# Patient Record
Sex: Male | Born: 1950 | Race: White | Hispanic: No | Marital: Married | Smoking: Never smoker
Health system: Southern US, Community
[De-identification: ages and names within clinical notes are randomized; demographics above are authoritative.]

## PROBLEM LIST (undated history)

## (undated) DIAGNOSIS — I10 Essential (primary) hypertension: Secondary | ICD-10-CM

## (undated) DIAGNOSIS — R972 Elevated prostate specific antigen [PSA]: Secondary | ICD-10-CM

## (undated) HISTORY — PX: TONSILLECTOMY: SUR1361

---

## 2014-11-11 ENCOUNTER — Emergency Department (HOSPITAL_COMMUNITY): Payer: Managed Care, Other (non HMO)

## 2014-11-11 ENCOUNTER — Encounter (HOSPITAL_COMMUNITY): Payer: Self-pay | Admitting: *Deleted

## 2014-11-11 ENCOUNTER — Emergency Department (HOSPITAL_COMMUNITY)
Admission: EM | Admit: 2014-11-11 | Discharge: 2014-11-12 | Disposition: A | Discharge status: Home / Self Care | Payer: Managed Care, Other (non HMO) | Attending: Emergency Medicine | Admitting: Emergency Medicine

## 2014-11-11 DIAGNOSIS — Z79899 Other long term (current) drug therapy: Secondary | ICD-10-CM | POA: Insufficient documentation

## 2014-11-11 DIAGNOSIS — R0789 Other chest pain: Secondary | ICD-10-CM | POA: Diagnosis not present

## 2014-11-11 DIAGNOSIS — R079 Chest pain, unspecified: Secondary | ICD-10-CM

## 2014-11-11 DIAGNOSIS — I10 Essential (primary) hypertension: Secondary | ICD-10-CM | POA: Diagnosis not present

## 2014-11-11 HISTORY — DX: Essential (primary) hypertension: I10

## 2014-11-11 HISTORY — DX: Elevated prostate specific antigen (PSA): R97.20

## 2014-11-11 LAB — BASIC METABOLIC PANEL
Anion gap: 9 (ref 5–15)
BUN: 16 mg/dL (ref 6–23)
CALCIUM: 9.2 mg/dL (ref 8.4–10.5)
CHLORIDE: 98 meq/L (ref 96–112)
CO2: 28 mmol/L (ref 19–32)
Creatinine, Ser: 0.91 mg/dL (ref 0.50–1.35)
GFR, EST NON AFRICAN AMERICAN: 90 mL/min — AB (ref >90)
Glucose, Bld: 101 mg/dL — ABNORMAL HIGH (ref 70–99)
Potassium: 3.5 mmol/L (ref 3.5–5.1)
SODIUM: 135 mmol/L (ref 135–145)

## 2014-11-11 LAB — CBC
HCT: 42.6 % (ref 39.0–52.0)
Hemoglobin: 14.5 g/dL (ref 13.0–17.0)
MCH: 30.3 pg (ref 26.0–34.0)
MCHC: 34 g/dL (ref 30.0–36.0)
MCV: 89.1 fL (ref 78.0–100.0)
Platelets: 271 10*3/uL (ref 150–400)
RBC: 4.78 MIL/uL (ref 4.22–5.81)
RDW: 13 % (ref 11.5–15.5)
WBC: 6.1 10*3/uL (ref 4.0–10.5)

## 2014-11-11 LAB — I-STAT TROPONIN, ED: Troponin i, poc: 0 ng/mL (ref 0.00–0.08)

## 2014-11-11 MED ORDER — GI COCKTAIL ~~LOC~~
30.0000 mL | Freq: Once | ORAL | Status: AC
  Administered 2014-11-11: 30 mL via ORAL
  Filled 2014-11-11: qty 30

## 2014-11-11 MED ORDER — IBUPROFEN 800 MG PO TABS
800.0000 mg | ORAL_TABLET | Freq: Once | ORAL | Status: DC
  Filled 2014-11-11: qty 1

## 2014-11-11 MED ORDER — HYDROCODONE-ACETAMINOPHEN 5-325 MG PO TABS
2.0000 | ORAL_TABLET | Freq: Once | ORAL | Status: AC
  Administered 2014-11-11: 2 via ORAL
  Filled 2014-11-11: qty 2

## 2014-11-11 MED ORDER — IBUPROFEN 800 MG PO TABS
800.0000 mg | ORAL_TABLET | Freq: Once | ORAL | Status: AC
  Administered 2014-11-11: 800 mg via ORAL
  Filled 2014-11-11: qty 1

## 2014-11-11 NOTE — ED Notes (Signed)
Pt states that he woke up this am with chest discomfort; pt states that he began to have chest discomfort this am; worse with palpation to the left side; pt states that he decided to take it easy this evening and eat bland and still continued to have chest discomfort; pt denies any other sx at present; pt states that he has had indigestion off and on and shortness of breath with the indigestion but none currently

## 2014-11-11 NOTE — ED Notes (Signed)
Urine collected and at bedside.

## 2014-11-11 NOTE — ED Provider Notes (Signed)
CSN: [id number]     Arrival date & time 11/11/14  2208 History   First MD Initiated Contact with Patient 11/11/14 2300     Chief Complaint  Patient presents with  . Chest Pain     (Consider location/radiation/quality/duration/timing/severity/associated sxs/prior Treatment) Patient is a 64 y.o. male presenting with chest pain.  Chest Pain Pain location:  L chest Pain quality: sharp   Pain radiates to:  Does not radiate Pain radiates to the back: no   Pain severity:  Mild Onset quality:  Gradual Duration:  3 days (worsening over past 3 days, history of similar pain for past several years) Timing:  Sporadic Progression:  Worsening Chronicity:  Chronic Context: trauma (with palpation or hitting of chest)   Relieved by:  Nothing Exacerbated by: palpation of chest. Associated symptoms: no abdominal pain, no cough, no fever, no nausea, no shortness of breath and not vomiting     Past Medical History  Diagnosis Date  . Hypertension   . Elevated PSA    Past Surgical History  Procedure Laterality Date  . Tonsillectomy     No family history on file. History  Substance Use Topics  . Smoking status: Never Smoker   . Smokeless tobacco: Not on file  . Alcohol Use: Yes     Comment: occ    Review of Systems  Constitutional: Negative for fever.  Respiratory: Negative for cough and shortness of breath.   Cardiovascular: Positive for chest pain. Negative for leg swelling.  Gastrointestinal: Negative for nausea, vomiting and abdominal pain.  All other systems reviewed and are negative.     Allergies  Review of patient's allergies indicates no known allergies.  Home Medications   Prior to Admission medications   Medication Sig Start Date End Date Taking? Authorizing Provider  Ascorbic Acid (VITAMIN C) 1000 MG tablet Take 1,000 mg by mouth 2 (two) times daily.   Yes Historical Provider, MD  diphenhydrAMINE (BENADRYL) 25 mg capsule Take 25 mg by mouth 2 (two) times daily.    Yes Historical Provider, MD  Multiple Vitamin (MULTIVITAMIN WITH MINERALS) TABS tablet Take 1 tablet by mouth 2 (two) times daily.   Yes Historical Provider, MD  Omega-3 Fatty Acids (FISH OIL PO) Take 1 tablet by mouth daily.   Yes Historical Provider, MD  valsartan (DIOVAN) 80 MG tablet Take 80 mg by mouth daily.   Yes Historical Provider, MD   BP 140/85 mmHg  Pulse 91  Temp(Src) 98 F (36.7 C) (Oral)  Resp 20  Ht 5\' 10"  (1.778 m)  Wt 165 lb (74.844 kg)  BMI 23.68 kg/m2  SpO2 100% Physical Exam  Constitutional: He is oriented to person, place, and time. He appears well-developed and well-nourished. No distress.  HENT:  Head: Normocephalic and atraumatic.  Mouth/Throat: No oropharyngeal exudate.  Eyes: EOM are normal. Pupils are equal, round, and reactive to light.  Neck: Normal range of motion. Neck supple.  Cardiovascular: Normal rate and regular rhythm.  Exam reveals no friction rub.   No murmur heard. Pulmonary/Chest: Effort normal and breath sounds normal. No respiratory distress. He has no wheezes. He has no rales. He exhibits tenderness (L sided).  Abdominal: He exhibits no distension. There is no tenderness. There is no rebound.  Musculoskeletal: Normal range of motion. He exhibits no edema.  Neurological: He is alert and oriented to person, place, and time.  Skin: He is not diaphoretic.  Nursing note and vitals reviewed.   ED Course  Procedures (including critical care time)  Labs Review Labs Reviewed  BASIC METABOLIC PANEL - Abnormal; Notable for the following:    Glucose, Bld 101 (*)    GFR calc non Af Amer 90 (*)    All other components within normal limits  CBC  I-STAT TROPOININ, ED    Imaging Review Dg Chest 2 View  11/11/2014   CLINICAL DATA:  Acute onset of left-sided chest pain and shortness of breath. Initial encounter.  EXAM: CHEST  2 VIEW  COMPARISON:  None.  FINDINGS: The lungs are well-aerated and clear. There is no evidence of focal opacification,  pleural effusion or pneumothorax.  The heart is normal in size; the mediastinal contour is within normal limits. No acute osseous abnormalities are seen.  IMPRESSION: No acute cardiopulmonary process seen.   Electronically Signed   By: Roanna Raider M.D.   On: 11/11/2014 22:59     EKG Interpretation   Date/Time:  Monday November 11 2014 22:17:05 EST Ventricular Rate:  107 PR Interval:  182 QRS Duration: 96 QT Interval:  344 QTC Calculation: 459 R Axis:   -19 Text Interpretation:  Sinus tachycardia Nonspecific ST abnormality  Nonspecific T wave abnormality No previous tracing Confirmed by Denton Lank   MD, Caryn Bee ([id number]) on 11/11/2014 10:39:31 PM      MDM   Final diagnoses:  Chest pain    64 year old male presents with chest pain. He's had multiple types of chest pain for the past 4 years. This episode has been for the past few days, he states it's worse with palpation and when his puppy and grandkids push on his chest. It's very brief, lasts only a few seconds. He thinks a slightly indigestion. No history of cardiac disease. No history of lung disease. He is hypertensive, but this is only risk factor for CAD. He reports multiple years of multiple types of chest pain, including some exertion no pain when picking things up for the past several years and this pain on palpation. It has been worse for the past few days. Here vitals stable. He has clear lungs, left-sided chest tenderness on palpation, which reproduces the pain he is here for. I suspect this is likely musculoskeletal versus GI. He is a very low heart score. EKG shows some inferior ST depression, have no prior EKG to compare this to. We'll repeat his troponin and repeat his EKG.  Repeat EKG without any inferior ST depression. Serial troponins negative. Stable for discharge, can f/u with his Cardiologist in [place].  Elwin Mocha, MD 11/12/14 [phone]

## 2014-11-12 LAB — I-STAT TROPONIN, ED: Troponin i, poc: 0 ng/mL (ref 0.00–0.08)

## 2014-11-12 MED ORDER — HYDROCODONE-ACETAMINOPHEN 5-325 MG PO TABS: 1.0000 | ORAL_TABLET | Freq: Four times a day (QID) | ORAL | Status: DC | PRN

## 2014-11-12 NOTE — Discharge Instructions (Signed)

## 2016-01-08 ENCOUNTER — Institutional Professional Consult (permissible substitution): Payer: Managed Care, Other (non HMO) | Admitting: Internal Medicine

## 2016-01-25 ENCOUNTER — Emergency Department (HOSPITAL_COMMUNITY)
Admission: EM | Admit: 2016-01-25 | Discharge: 2016-01-25 | Disposition: A | Discharge status: Home / Self Care | Payer: Managed Care, Other (non HMO) | Attending: Emergency Medicine | Admitting: Emergency Medicine

## 2016-01-25 ENCOUNTER — Emergency Department (HOSPITAL_COMMUNITY): Payer: Managed Care, Other (non HMO)

## 2016-01-25 DIAGNOSIS — I159 Secondary hypertension, unspecified: Secondary | ICD-10-CM | POA: Insufficient documentation

## 2016-01-25 DIAGNOSIS — Z79899 Other long term (current) drug therapy: Secondary | ICD-10-CM | POA: Diagnosis not present

## 2016-01-25 DIAGNOSIS — I10 Essential (primary) hypertension: Secondary | ICD-10-CM | POA: Diagnosis present

## 2016-01-25 DIAGNOSIS — R079 Chest pain, unspecified: Secondary | ICD-10-CM | POA: Insufficient documentation

## 2016-01-25 LAB — BASIC METABOLIC PANEL
Anion gap: 8 (ref 5–15)
BUN: 12 mg/dL (ref 6–20)
CO2: 30 mmol/L (ref 22–32)
CREATININE: 0.89 mg/dL (ref 0.61–1.24)
Calcium: 9.5 mg/dL (ref 8.9–10.3)
Chloride: 103 mmol/L (ref 101–111)
Glucose, Bld: 106 mg/dL — ABNORMAL HIGH (ref 65–99)
Potassium: 4.5 mmol/L (ref 3.5–5.1)
SODIUM: 141 mmol/L (ref 135–145)

## 2016-01-25 LAB — CBC
HEMATOCRIT: 42.2 % (ref 39.0–52.0)
Hemoglobin: 14.5 g/dL (ref 13.0–17.0)
MCH: 29.9 pg (ref 26.0–34.0)
MCHC: 34.4 g/dL (ref 30.0–36.0)
MCV: 87 fL (ref 78.0–100.0)
Platelets: 262 10*3/uL (ref 150–400)
RBC: 4.85 MIL/uL (ref 4.22–5.81)
RDW: 13.2 % (ref 11.5–15.5)
WBC: 5.9 10*3/uL (ref 4.0–10.5)

## 2016-01-25 LAB — I-STAT TROPONIN, ED: Troponin i, poc: 0 ng/mL (ref 0.00–0.08)

## 2016-01-25 NOTE — ED Notes (Signed)
Pt states that he was taken off his BP meds and his BP was high today so he took this morning. 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 Chest pain. Alert and oriented.

## 2016-01-25 NOTE — ED Notes (Signed)
Pt reports understanding of discharge information. No questions at time of discharge 

## 2016-01-25 NOTE — ED Provider Notes (Signed)
CSN: [id number]     Arrival date & time 01/25/16  2038 History   First MD Initiated Contact with Patient 01/25/16 2211     Chief Complaint  Patient presents with  . Hypertension  . Chest Pain     (Consider location/radiation/quality/duration/timing/severity/associated sxs/prior Treatment) HPI  Expand All Collapse All   Pt states that he was taken off his BP meds and his BP was high today so he took this morning. 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 Patient denies chest pain at the current time. Alert and oriented.        Past Medical History  Diagnosis Date  . Hypertension   . Elevated PSA    Past Surgical History  Procedure Laterality Date  . Tonsillectomy  age 65   Family History  Problem Relation Age of Onset  . Lung cancer Paternal Grandmother     smoker  . Other Father     Pulmonary Fibrosis, smoker  . Alzheimer's disease Mother    Social History  Substance Use Topics  . Smoking status: Never Smoker   . Smokeless tobacco: Not on file     Comment: socially in college  . Alcohol Use: 0.0 oz/week    0 Standard drinks or equivalent per week     Comment: 2 glasses of wine/beer w/ dinner    Review of Systems  All other systems reviewed and are negative.     Allergies  Review of patient's allergies indicates no known allergies.  Home Medications   Prior to Admission medications   Medication Sig Start Date End Date Taking? Authorizing Provider  Albuterol Sulfate (PROAIR RESPICLICK) 108 (90 Base) MCG/ACT AEPB Inhale 2 puffs into the lungs every 6 (six) hours as needed (wheezing, shortness of breath). 01/27/16   Praveen Mannam, MD  Ascorbic Acid (VITAMIN C) 1000 MG tablet Take 1,000 mg by mouth 2 (two) times daily.    Historical Provider, MD  diphenhydrAMINE (BENADRYL) 25 mg capsule Take 25 mg by mouth 2 (two) times daily.    Historical Provider, MD  Multiple Vitamin (MULTIVITAMIN WITH MINERALS) TABS tablet Take 1 tablet by mouth 2  (two) times daily.    Historical Provider, MD  Omega-3 Fatty Acids (FISH OIL PO) Take 1 tablet by mouth daily.    Historical Provider, MD  valsartan (DIOVAN) 80 MG tablet Take 80 mg by mouth daily.    Historical Provider, MD   BP 132/90 mmHg  Pulse 79  Temp(Src) 98.3 F (36.8 C) (Oral)  Resp 19  SpO2 99% Physical Exam  Constitutional: He is oriented to person, place, and time. He appears well-developed and well-nourished. No distress.  HENT:  Head: Normocephalic and atraumatic.  Eyes: Pupils are equal, round, and reactive to light.  Neck: Normal range of motion.  Cardiovascular: Normal rate and intact distal pulses.   Pulmonary/Chest: No respiratory distress.  Abdominal: Normal appearance. He exhibits no distension.  Musculoskeletal: Normal range of motion.  Neurological: He is alert and oriented to person, place, and time. No cranial nerve deficit.  Skin: Skin is warm and dry. No rash noted.  Psychiatric: He has a normal mood and affect. His behavior is normal.  Nursing note and vitals reviewed.   ED Course  Procedures (including critical care time) Medications - No data to display  BP is 130/90.  After treatment in the ED the patient feels back to baseline and wants to go home. Labs Review Labs Reviewed  BASIC METABOLIC  PANEL - Abnormal; Notable for the following:    Glucose, Bld 106 (*)    All other components within normal limits  CBC  I-STAT TROPOININ, ED    Imaging Review No results found. I have personally reviewed and evaluated these images and lab results as part of my medical decision-making.   EKG Interpretation   Date/Time:  Sunday January 25 2016 20:48:28 EDT Ventricular Rate:  81 PR Interval:  135 QRS Duration: 91 QT Interval:  369 QTC Calculation: 428 R Axis:   50 Text Interpretation:  Sinus rhythm RSR' in V1 or V2, probably normal  variant No significant change since last tracing Confirmed by Ladaija Dimino  MD,  Garret [phone]) on 01/25/2016 10:14:20 PM  Also confirmed by Radford Pax  MD,  [place] [phone]), editor [place]  CCT, [place] ([id number])  on 01/26/2016  6:53:22 AM     After treatment in the ED the patient feels back to baseline and wants to go home. MDM   Final diagnoses:  Secondary hypertension, unspecified        Nelva Nay, MD 01/28/16 [phone]

## 2016-01-25 NOTE — Discharge Instructions (Signed)

## 2016-01-27 ENCOUNTER — Encounter: Payer: Self-pay | Admitting: Pulmonary Disease

## 2016-01-27 ENCOUNTER — Ambulatory Visit (INDEPENDENT_AMBULATORY_CARE_PROVIDER_SITE_OTHER): Payer: Managed Care, Other (non HMO) | Admitting: Pulmonary Disease

## 2016-01-27 VITALS — BP 104/68 | HR 86 | Ht 70.5 in | Wt 167.2 lb

## 2016-01-27 DIAGNOSIS — R059 Cough, unspecified: Secondary | ICD-10-CM

## 2016-01-27 DIAGNOSIS — R05 Cough: Secondary | ICD-10-CM

## 2016-01-27 MED ORDER — ALBUTEROL SULFATE 108 (90 BASE) MCG/ACT IN AEPB: 2.0000 | INHALATION_SPRAY | Freq: Four times a day (QID) | RESPIRATORY_TRACT | Status: DC | PRN

## 2016-01-27 NOTE — Progress Notes (Signed)
   Subjective:    Patient ID: Jeremy Glover, male    DOB: 12/08/1950, 65 y.o.   MRN: [id number]  HPI  Evaluation for cough.  Mr. Ernestina PennaWiggins is a 65 year old with past medical history of hypertension. He has chronic cough for the past few years. This is nonproductive in nature, not associated with dyspnea, wheezing. He has daily symptoms. He wakes up in the morning coughing. He has issues of nasal discharge, postnasal drip and. He is being followed by Dr. Suszanne Connerseoh, ENT who has started him on nasal spray and pills to try his situation. He does not recall what these medications are. He had been tried on Flonase in the past but had to stop it because it caused nasal bleeding. He was also put on acid suppression medication but this did not help.  He was evaluated in the ED 2 days ago for hypertension when he stopped taking his medication and chest pain. A chest x-ray at that time showed no acute cardiopulmonary abnormality.  DATA: CXR 01/25/16 No acute cardiopulmonary disease Images reviewed  Social History: Never smoker. 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  Past Medical History  Diagnosis Date  . Hypertension   . Elevated PSA     Current outpatient prescriptions:  .  Ascorbic Acid (VITAMIN C) 1000 MG tablet, Take 1,000 mg by mouth 2 (two) times daily., Disp: , Rfl:  .  diphenhydrAMINE (BENADRYL) 25 mg capsule, Take 25 mg by mouth 2 (two) times daily., Disp: , Rfl:  .  Multiple Vitamin (MULTIVITAMIN WITH MINERALS) TABS tablet, Take 1 tablet by mouth 2 (two) times daily., Disp: , Rfl:  .  Omega-3 Fatty Acids (FISH OIL PO), Take 1 tablet by mouth daily., Disp: , Rfl:  .  valsartan (DIOVAN) 80 MG tablet, Take 80 mg by mouth daily., Disp: , Rfl:   Review of Systems Cough, nonproductive. No dyspnea,  wheezing, hemoptysis. No chest pain, palpitation. No nausea, vomiting, diarrhea, constipation. No fevers, chills. All other review of systems are negative    Objective:   Physical Exam Blood pressure 104/68, pulse 86, height 5' 10.5" (1.791 m), weight 167 lb 3.2 oz (75.841 kg), SpO2 96 %. Gen: No apparent distress Neuro: No gross focal deficits. Neck: No JVD, lymphadenopathy, thyromegaly. RS: Clear, No wheeze or crackles CVS: S1-S2 heard, no murmurs rubs gallops. Abdomen: Soft, positive bowel sounds. Extremities: No edema.    Assessment & Plan:  Cough.  Likely from upper airway syndrome, postnasal drip. He is being evaluated by ENT and is on medications. Acid suppression the past have not helped with her symptoms. I doubt whether if this is reactive airway disease, asthma. He has a family history of lung problems including cancer, pulmonary fibrosis and wants an evaluation. I reassured him that his chest x-ray looks normal. He is a never smoker and has no relevant exposures. He'll get scheduled for pulmonary function tests and I'll give him a trial of albuterol rescue inhaler.  Plan: - PFTs - Albuterol rescue inhaler.  Return on 3 months.   Chilton GreathousePraveen Martavia Tye MD [facility] Pager [phone] If no answer or after 3pm call: [phone] 01/27/2016, 9:37 AM

## 2016-01-27 NOTE — Patient Instructions (Signed)
We will schedule you for lung function tests. You will be started on an albuterol rescue inhaler.  Return to clinic in 3 months.

## 2016-04-30 ENCOUNTER — Ambulatory Visit (INDEPENDENT_AMBULATORY_CARE_PROVIDER_SITE_OTHER): Payer: Managed Care, Other (non HMO) | Admitting: Pulmonary Disease

## 2016-04-30 ENCOUNTER — Encounter (INDEPENDENT_AMBULATORY_CARE_PROVIDER_SITE_OTHER): Payer: Self-pay

## 2016-04-30 ENCOUNTER — Encounter: Payer: Self-pay | Admitting: Pulmonary Disease

## 2016-04-30 VITALS — BP 102/64 | HR 88 | Ht 70.0 in | Wt 166.0 lb

## 2016-04-30 DIAGNOSIS — R059 Cough, unspecified: Secondary | ICD-10-CM

## 2016-04-30 DIAGNOSIS — R05 Cough: Secondary | ICD-10-CM | POA: Diagnosis not present

## 2016-04-30 DIAGNOSIS — R058 Other specified cough: Secondary | ICD-10-CM | POA: Insufficient documentation

## 2016-04-30 LAB — PULMONARY FUNCTION TEST
DL/VA % PRED: 82 %
DL/VA: 3.8 ml/min/mmHg/L
DLCO COR: 22.27 ml/min/mmHg
DLCO cor % pred: 68 %
DLCO unc % pred: 69 %
DLCO unc: 22.58 ml/min/mmHg
FEF 25-75 Post: 2.32 L/sec
FEF 25-75 Pre: 1.85 L/sec
FEF2575-%CHANGE-POST: 25 %
FEF2575-%PRED-PRE: 66 %
FEF2575-%Pred-Post: 82 %
FEV1-%CHANGE-POST: 5 %
FEV1-%PRED-PRE: 80 %
FEV1-%Pred-Post: 85 %
FEV1-Post: 2.98 L
FEV1-Pre: 2.81 L
FEV1FVC-%CHANGE-POST: 4 %
FEV1FVC-%Pred-Pre: 96 %
FEV6-%Change-Post: 2 %
FEV6-%PRED-PRE: 86 %
FEV6-%Pred-Post: 88 %
FEV6-POST: 3.92 L
FEV6-PRE: 3.83 L
FEV6FVC-%Change-Post: 1 %
FEV6FVC-%PRED-POST: 104 %
FEV6FVC-%PRED-PRE: 103 %
FVC-%CHANGE-POST: 1 %
FVC-%PRED-POST: 84 %
FVC-%PRED-PRE: 83 %
FVC-POST: 3.94 L
FVC-PRE: 3.89 L
POST FEV6/FVC RATIO: 100 %
PRE FEV6/FVC RATIO: 98 %
Post FEV1/FVC ratio: 76 %
Pre FEV1/FVC ratio: 72 %
RV % PRED: 93 %
RV: 2.17 L
TLC % PRED: 85 %
TLC: 5.99 L

## 2016-04-30 MED ORDER — PANTOPRAZOLE SODIUM 40 MG PO TBEC: 40.0000 mg | DELAYED_RELEASE_TABLET | Freq: Every day | ORAL | Status: DC

## 2016-04-30 MED ORDER — BECLOMETHASONE DIPROPIONATE 40 MCG/ACT IN AERS: 1.0000 | INHALATION_SPRAY | Freq: Two times a day (BID) | RESPIRATORY_TRACT | Status: DC

## 2016-04-30 MED ORDER — AZELASTINE-FLUTICASONE 137-50 MCG/ACT NA SUSP: 1.0000 | Freq: Two times a day (BID) | NASAL | Status: DC

## 2016-04-30 NOTE — Assessment & Plan Note (Signed)
Upper Airway Cough Syndrome:Suspect GERD contributing Plan: We will add Protonix 40 mg for reflux We will start you on Qvar 40 1 puff twice daily. Start Dymista 1 spray each nare twice daily. Rinse mouth / brush teeth after use. Stop Benadryl Start chlorpheniramine 8 mg three times daily This will make you sleepy. Do not drive if sleepy. I will give you a copy of the GERD diet. Sips of water instead of throat clearing Sugar free jolly ranchers to soothe your throat. Avoid chocolate, menthol and mint. You can elevate your bed on bed blocks  Add Claritin  or any other non-sedating anti histamine once daily. You can try saline mist for nasal stuffiness. Follow up with Dr. Isaiah SergeMannam in 3 months Please contact office for sooner follow up if symptoms do not improve or worsen or seek emergency care

## 2016-04-30 NOTE — Progress Notes (Signed)
History of Present Illness Jeremy Glover is a 65 y.o. male with chronic cough for the past several years   6/30/2017Follow Up Appointment: Patient returns today for 3 month follow up. He is being seen by Dr. Isaiah SergeMannam for chronic cough. He states he is about the same. Still has the cough that is non-productive and it is not associated with dyspnea or wheezing.He has seen an EMT who has placed him on Gabapentin. He states that it made some difference initially, but the cough has recurred. He thinks his ENT is going to wean him off the gabapentin He is currently not on a PPI. He does have frequent throat clearing,and continued post nasal drip.PFT's indicate + 25% change to BD. Pt denies fever, chest pain, orthopnea, hemoptysis, leg or calf pain.   Tests  PFT's 04/30/2016 ( Personally reviewed by Dr. Isaiah SergeMannam)  FVC: 3.89 ( 83%) FEV! 2.81  ( 80%) F/F 73 TLC 5.99 (85%) DLCO 69% DB response +25%  CXR 01/25/2016:  No Acute cardiopulmonary disease  Past medical hx Past Medical History  Diagnosis Date  . Hypertension   . Elevated PSA      Past surgical hx, Family hx, Social hx all reviewed.  Current Outpatient Prescriptions on File Prior to Visit  Medication Sig  . Albuterol Sulfate (PROAIR RESPICLICK) 108 (90 Base) MCG/ACT AEPB Inhale 2 puffs into the lungs every 6 (six) hours as needed (wheezing, shortness of breath).  . Ascorbic Acid (VITAMIN C) 1000 MG tablet Take 1,000 mg by mouth 2 (two) times daily.  . diphenhydrAMINE (BENADRYL) 25 mg capsule Take 25 mg by mouth 2 (two) times daily.  . Multiple Vitamin (MULTIVITAMIN WITH MINERALS) TABS tablet Take 1 tablet by mouth 2 (two) times daily.  . Omega-3 Fatty Acids (FISH OIL PO) Take 1 tablet by mouth daily.  . valsartan (DIOVAN) 80 MG tablet Take 80 mg by mouth daily.   No current facility-administered medications on file prior to visit.     No Known Allergies  Review Of Systems:  Constitutional:   No  weight loss, night sweats,   Fevers, chills, fatigue, or  lassitude. HEENT:   No headaches,  Difficulty swallowing,  Tooth/dental problems, or  Sore throat,                No sneezing, itching, ear ache, +nasal congestion, +post nasal drip,   CV:  No chest pain,  Orthopnea, PND, swelling in lower extremities, anasarca, dizziness, palpitations, syncope.   GI  No heartburn, indigestion, abdominal pain, nausea, vomiting, diarrhea, change in bowel habits, loss of appetite, bloody stools.   Resp: No shortness of breath with exertion or at rest.  No excess mucus, no productive cough,  + non-productive cough,  No coughing up of blood.  No change in color of mucus.  No wheezing.  No chest wall deformity  Skin: no rash or lesions.  GU: no dysuria, change in color of urine, no urgency or frequency.  No flank pain, no hematuria   MS:  No joint pain or swelling.  No decreased range of motion.  No back pain.  Psych:  No change in mood or affect. No depression or anxiety.  No memory loss.   Vital Signs BP 102/64 mmHg  Pulse 88  Ht 5\' 10"  (1.778 m)  Wt 166 lb (75.297 kg)  BMI 23.82 kg/m2  SpO2 96%   Physical Exam:  General- No distress,  A&Ox3, pleasant ENT: No sinus tenderness, TM clear, pale nasal mucosa, no oral  exudate,+ post nasal drip, no LAN Cardiac: S1, S2, regular rate and rhythm, no murmur Chest: No wheeze/ rales/ dullness; no accessory muscle use, no nasal flaring, no sternal retractions Abd.: Soft Non-tender Ext: No clubbing cyanosis, edema Neuro:  normal strength Skin: No rashes, warm and dry Psych: normal mood and behavior   Assessment/Plan  Upper airway cough syndrome Upper Airway Cough Syndrome:Suspect GERD contributing Plan: We will add Protonix 40 mg for reflux We will start you on Qvar 40 1 puff twice daily. Start Dymista 1 spray each nare twice daily. Rinse mouth / brush teeth after use. Stop Benadryl Start chlorpheniramine 8 mg three times daily This will make you sleepy. Do not drive  if sleepy. I will give you a copy of the GERD diet. Sips of water instead of throat clearing Sugar free jolly ranchers to soothe your throat. Avoid chocolate, menthol and mint. You can elevate your bed on bed blocks  Add Claritin  or any other non-sedating anti histamine once daily. You can try saline mist for nasal stuffiness. Follow up with Dr. Isaiah SergeMannam in 3 months Please contact office for sooner follow up if symptoms do not improve or worsen or seek emergency care      Bevelyn NgoSarah F Kenzi Bardwell, NP 04/30/2016  2:29 PM   Attending note: I have seen and examined the patient with nurse practitioner/resident and agree with the note. History, labs and imaging reviewed.  [name] has upper airway cough syndrome Review of PFTs shows reduction in midflow rates suggestive of small airway disease  We will try to get better control of his rhinitis, post nasal drip Get started on qvar and protonix.  Chilton GreathousePraveen Mannam MD [facility] Pager [phone] If no answer or after 3pm call: [phone] 04/30/2016, 2:42 PM

## 2016-04-30 NOTE — Progress Notes (Signed)
PFT done today. 

## 2016-04-30 NOTE — Patient Instructions (Addendum)
It is nice to meet you today. We will add Protonix 40 mg for reflux We will start you on Qvar 40 1 puff twice daily. Start Dymista 1 spray each nare twice daily. Rinse mouth / brush teeth after use. Stop Benadryl Start chlorpheniramine 8 mg three times daily This will make you sleepy. Do not drive if sleepy. I will give you a copy of the GERD diet. Sips of water instead of throat clearing Sugar free jolly ranchers to soothe your throat. Avoid chocolate, menthol and mint. You can elevate your bed on bed blocks  Add Claritin  or any other non-sedating anti histamine once daily. You can try saline mist for nasal stuffiness. Follow up with Dr. Isaiah SergeMannam in 3 months Please contact office for sooner follow up if symptoms do not improve or worsen or seek emergency care

## 2016-08-02 ENCOUNTER — Ambulatory Visit (INDEPENDENT_AMBULATORY_CARE_PROVIDER_SITE_OTHER): Payer: Managed Care, Other (non HMO) | Admitting: Pulmonary Disease

## 2016-08-02 ENCOUNTER — Encounter: Payer: Self-pay | Admitting: Pulmonary Disease

## 2016-08-02 VITALS — BP 118/78 | HR 79 | Ht 70.0 in | Wt 164.2 lb

## 2016-08-02 DIAGNOSIS — R05 Cough: Secondary | ICD-10-CM

## 2016-08-02 DIAGNOSIS — R058 Other specified cough: Secondary | ICD-10-CM

## 2016-08-02 NOTE — Patient Instructions (Signed)
Instructed to wean off the Qvar. Continue the Protonix and the Zyrtec and nasal spray.  Return to clinic in 3 months.

## 2016-08-02 NOTE — Progress Notes (Signed)
Jeremy Glover    [id number]    1951-09-26  Primary Care Physician: Duane Lope, MD  Referring Physician: Gildardo Cranker, MD [address] [place], [place] [address]  Chief complaint:   Follow up for chronic cough  HPI: Jeremy Glover is a 65 year old with past medical history of hypertension. Jeremy Glover has chronic cough for the past few years. This is nonproductive in nature, not associated with dyspnea, wheezing. Jeremy Glover has daily symptoms. Jeremy Glover wakes up in the morning coughing. Jeremy Glover has issues of nasal discharge, postnasal drip and. Jeremy Glover is being followed by Dr. Suszanne Conners, ENT who has started him on nasal spray and gabapentin.  His had a recent EGD which showed a few gastric polyps, GERD without esophagitis. Is now on Protonix. Jeremy Glover was started on Qvar at the last visit. Jeremy Glover does not notice any difference in symptoms however Jeremy Glover reports intermittent atypical chest pain on using the Qvar.  Outpatient Encounter Prescriptions as of 08/02/2016  Medication Sig  . Albuterol Sulfate (PROAIR RESPICLICK) 108 (90 Base) MCG/ACT AEPB Inhale 2 puffs into the lungs every 6 (six) hours as needed (wheezing, shortness of breath).  . Ascorbic Acid (VITAMIN C) 1000 MG tablet Take 1,000 mg by mouth 2 (two) times daily.  . beclomethasone (QVAR) 40 MCG/ACT inhaler Inhale 1 puff into the lungs 2 (two) times daily.  . cetirizine (ZYRTEC) 10 MG tablet Take 10 mg by mouth 2 (two) times daily.  . Multiple Vitamin (MULTIVITAMIN WITH MINERALS) TABS tablet Take 1 tablet by mouth 2 (two) times daily.  . Omega-3 Fatty Acids (FISH OIL PO) Take 1 tablet by mouth daily.  . pantoprazole (PROTONIX) 40 MG tablet Take 1 tablet (40 mg total) by mouth daily.  . valsartan (DIOVAN) 80 MG tablet Take 80 mg by mouth daily.  . Azelastine-Fluticasone 137-50 MCG/ACT SUSP Place 1 spray into the nose 2 (two) times daily. (Patient not taking: Reported on 08/02/2016)  . diphenhydrAMINE (BENADRYL) 25 mg capsule Take 25 mg by mouth 2 (two) times daily.  Marland Kitchen  gabapentin (NEURONTIN) 300 MG capsule Take 300 mg by mouth 3 (three) times daily.   No facility-administered encounter medications on file as of 08/02/2016.     Allergies as of 08/02/2016  . (No Known Allergies)    Past Medical History:  Diagnosis Date  . Elevated PSA   . Hypertension     Past Surgical History:  Procedure Laterality Date  . TONSILLECTOMY  age 68    Family History  Problem Relation Age of Onset  . Lung cancer Paternal Grandmother     smoker  . Other Father     Pulmonary Fibrosis, smoker  . Alzheimer's disease Mother     Social History   Social History  . Marital status: Married    Spouse name: N/A  . Number of children: N/A  . Years of education: N/A   Occupational History  . Not on file.   Social History Main Topics  . Smoking status: Never Smoker  . Smokeless tobacco: Never Used     Comment: socially in college  . Alcohol use 0.0 oz/week     Comment: 2 glasses of wine/beer w/ dinner  . Drug use: No  . Sexual activity: Not on file   Other Topics Concern  . Not on file   Social History Narrative   Married, lives with spouse   2 grown Energy manager   Recent travel to [place] early March (  they own a house there)     Review of systems: Review of Systems  Constitutional: Negative for fever and chills.  HENT: Negative.   Eyes: Negative for blurred vision.  Respiratory: as per HPI  Cardiovascular: Negative for chest pain and palpitations.  Gastrointestinal: Negative for vomiting, diarrhea, blood per rectum. Genitourinary: Negative for dysuria, urgency, frequency and hematuria.  Musculoskeletal: Negative for myalgias, back pain and joint pain.  Skin: Negative for itching and rash.  Neurological: Negative for dizziness, tremors, focal weakness, seizures and loss of consciousness.  Endo/Heme/Allergies: Negative for environmental allergies.  Psychiatric/Behavioral: Negative for depression, suicidal ideas  and hallucinations.  All other systems reviewed and are negative.   Physical Exam: There were no vitals taken for this visit. Gen:      No acute distress HEENT:  EOMI, sclera anicteric Neck:     No masses; no thyromegaly Lungs:    Clear to auscultation bilaterally; normal respiratory effort CV:         Regular rate and rhythm; no murmurs Abd:      + bowel sounds; soft, non-tender; no palpable masses, no distension Ext:    No edema; adequate peripheral perfusion Skin:      Warm and dry; no rash Neuro: alert and oriented x 3 Psych: normal mood and affect  Data Reviewed: PFT's 04/30/2016 FVC: 3.89 ( 83%) FEV! 2.81  ( 80%) F/F 73 TLC 5.99 (85%) DLCO 69% DB response +25%  CXR 01/25/2016: No Acute cardiopulmonary disease. Images reviewed  EGD 07/13/16 Few gastric polyps, biopsy. Normal duodenum, GERD without esophagitis  Assessment:  Cough Likely from upper airway syndrome, postnasal drip. GERD. Jeremy Glover is currently on Protonix. Jeremy Glover also continues on Zyrtec, dymista nasal spray. Symptoms are better with behavioral changes to cough including use of throat lozenges, sips of water, suppression of urge to cough.  His PFTs shows possible small airway disease but I doubt if this is reactive airway disease, asthma. Jeremy Glover says that Qvar does not help and Jeremy Glover has some intermittent chest pain from it. I have asked him to stop the Qvar Jeremy Glover has a family history of lung problems including cancer, pulmonary fibrosis and wants an evaluation. I reassured him that his chest x-ray looks normal. Jeremy Glover is a never smoker and has no relevant exposures. If his chest pain continues after stopping the inhaler than Jeremy Glover would prefer to get a CT chest for a complete evaluation.  Plan/Recommendations: - OK to stop qvar - Continue zyrtec, dymista, protonix  Chilton GreathousePraveen Alizey Noren MD [facility] Pager [phone] 08/02/2016, 4:49 PM  CC: Gildardo Crankeross, Charles, MD

## 2016-08-03 ENCOUNTER — Encounter: Payer: Self-pay | Admitting: Pulmonary Disease

## 2016-08-24 ENCOUNTER — Encounter: Payer: Self-pay | Admitting: Pulmonary Disease

## 2016-11-17 ENCOUNTER — Ambulatory Visit: Payer: Managed Care, Other (non HMO) | Admitting: Pulmonary Disease

## 2017-01-21 ENCOUNTER — Encounter: Payer: Self-pay | Admitting: Pulmonary Disease

## 2017-01-21 ENCOUNTER — Other Ambulatory Visit (INDEPENDENT_AMBULATORY_CARE_PROVIDER_SITE_OTHER): Payer: Managed Care, Other (non HMO)

## 2017-01-21 ENCOUNTER — Ambulatory Visit (INDEPENDENT_AMBULATORY_CARE_PROVIDER_SITE_OTHER): Payer: Managed Care, Other (non HMO) | Admitting: Pulmonary Disease

## 2017-01-21 VITALS — BP 120/70 | HR 85 | Ht 70.0 in | Wt 170.0 lb

## 2017-01-21 DIAGNOSIS — R05 Cough: Secondary | ICD-10-CM | POA: Diagnosis not present

## 2017-01-21 DIAGNOSIS — R059 Cough, unspecified: Secondary | ICD-10-CM

## 2017-01-21 DIAGNOSIS — J4530 Mild persistent asthma, uncomplicated: Secondary | ICD-10-CM | POA: Insufficient documentation

## 2017-01-21 LAB — CBC WITH DIFFERENTIAL/PLATELET
BASOS PCT: 0.6 % (ref 0.0–3.0)
Basophils Absolute: 0 10*3/uL (ref 0.0–0.1)
EOS PCT: 5.5 % — AB (ref 0.0–5.0)
Eosinophils Absolute: 0.3 10*3/uL (ref 0.0–0.7)
HCT: 44.4 % (ref 39.0–52.0)
HEMOGLOBIN: 14.9 g/dL (ref 13.0–17.0)
Lymphocytes Relative: 43.8 % (ref 12.0–46.0)
Lymphs Abs: 2.6 10*3/uL (ref 0.7–4.0)
MCHC: 33.6 g/dL (ref 30.0–36.0)
MCV: 89.4 fl (ref 78.0–100.0)
MONO ABS: 0.7 10*3/uL (ref 0.1–1.0)
MONOS PCT: 11.5 % (ref 3.0–12.0)
Neutro Abs: 2.3 10*3/uL (ref 1.4–7.7)
Neutrophils Relative %: 38.6 % — ABNORMAL LOW (ref 43.0–77.0)
Platelets: 252 10*3/uL (ref 150.0–400.0)
RBC: 4.97 Mil/uL (ref 4.22–5.81)
RDW: 13.7 % (ref 11.5–15.5)
WBC: 5.9 10*3/uL (ref 4.0–10.5)

## 2017-01-21 LAB — NITRIC OXIDE: NITRIC OXIDE: 49

## 2017-01-21 MED ORDER — FLUTICASONE FUROATE-VILANTEROL 200-25 MCG/INH IN AEPB: 1.0000 | INHALATION_SPRAY | Freq: Every day | RESPIRATORY_TRACT | 5 refills | Status: DC

## 2017-01-21 MED ORDER — FLUTICASONE FUROATE-VILANTEROL 200-25 MCG/INH IN AEPB: 1.0000 | INHALATION_SPRAY | Freq: Every day | RESPIRATORY_TRACT | 0 refills | Status: AC

## 2017-01-21 NOTE — Progress Notes (Signed)
Jeremy Glover    [id number]    02/27/1951  Primary Care Physician:Alan Tenny Crawoss, MD  RefGwenlyn Saranerring Physician: Daisy Floroharles Alan Ross, MD [address] [place], [place] [id number]  Chief complaint:   Follow up for  Chronic cough Mild persistent asthma  HPI: Jeremy Glover is a 66 year old with past medical history of hypertension. He has chronic cough for the past few years. This is nonproductive in nature, not associated with dyspnea, wheezing. He has daily symptoms. He wakes up in the morning coughing. He has issues of nasal discharge, postnasal drip and. He is being followed by Dr. Suszanne Connerseoh, ENT who has started him on nasal spray and gabapentin.  His had a recent EGD which showed a few gastric polyps, GERD without esophagitis. Is now on Protonix. He was started on Qvar at the last visit. He does not notice any difference in symptoms however he reports intermittent atypical chest pain on using the Qvar.  Interim History: He has noticed slightly increasing cough, occasional dyspnea since his last visit. Is not on any inhalers and has stopped taking the qvar  Outpatient Encounter Prescriptions as of 01/21/2017  Medication Sig  . Ascorbic Acid (VITAMIN C) 1000 MG tablet Take 1,000 mg by mouth 2 (two) times daily.  . beclomethasone (QVAR) 40 MCG/ACT inhaler Inhale 1 puff into the lungs 2 (two) times daily.  . cetirizine (ZYRTEC) 10 MG tablet Take 10 mg by mouth 2 (two) times daily.  . Multiple Vitamin (MULTIVITAMIN WITH MINERALS) TABS tablet Take 1 tablet by mouth 2 (two) times daily.  . Omega-3 Fatty Acids (FISH OIL PO) Take 1 tablet by mouth daily.  . valsartan (DIOVAN) 80 MG tablet Take 80 mg by mouth daily.  . [DISCONTINUED] Azelastine-Fluticasone 137-50 MCG/ACT SUSP Place 1 spray into the nose 2 (two) times daily.  . [DISCONTINUED] diphenhydrAMINE (BENADRYL) 25 mg capsule Take 25 mg by mouth 2 (two) times daily.  . [DISCONTINUED] gabapentin (NEURONTIN) 300 MG capsule Take 300 mg by mouth 3  (three) times daily.  . [DISCONTINUED] pantoprazole (PROTONIX) 40 MG tablet Take 1 tablet (40 mg total) by mouth daily.  . Albuterol Sulfate (PROAIR RESPICLICK) 108 (90 Base) MCG/ACT AEPB Inhale 2 puffs into the lungs every 6 (six) hours as needed (wheezing, shortness of breath). (Patient not taking: Reported on 01/21/2017)   No facility-administered encounter medications on file as of 01/21/2017.     Allergies as of 01/21/2017  . (No Known Allergies)    Past Medical History:  Diagnosis Date  . Elevated PSA   . Hypertension     Past Surgical History:  Procedure Laterality Date  . TONSILLECTOMY  age 18    Family History  Problem Relation Age of Onset  . Lung cancer Paternal Grandmother     smoker  . Other Father     Pulmonary Fibrosis, smoker  . Alzheimer's disease Mother     Social History   Social History  . Marital status: Married    Spouse name: N/A  . Number of children: N/A  . Years of education: N/A   Occupational History  . Not on file.   Social History Main Topics  . Smoking status: Never Smoker  . Smokeless tobacco: Never Used     Comment: socially in college  . Alcohol use 0.0 oz/week     Comment: 2 glasses of wine/beer w/ dinner  . Drug use: No  . Sexual activity: Not on file   Other Topics Concern  .  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  HENT: Negative.   Eyes: Negative for blurred vision.  Respiratory: as per HPI  Cardiovascular: Negative for chest pain and palpitations.  Gastrointestinal: Negative for vomiting, diarrhea, blood per rectum. Genitourinary: Negative for dysuria, urgency, frequency and hematuria.  Musculoskeletal: Negative for myalgias, back pain and joint pain.  Skin: Negative for  itching and rash.  Neurological: Negative for dizziness, tremors, focal weakness, seizures and loss of consciousness.  Endo/Heme/Allergies: Negative for environmental allergies.  Psychiatric/Behavioral: Negative for depression, suicidal ideas and hallucinations.  All other systems reviewed and are negative.   Physical Exam: Blood pressure 120/70, pulse 85, height 5\' 10"  (1.778 m), weight 170 lb (77.1 kg), SpO2 96 %. Gen:      No acute distress HEENT:  EOMI, sclera anicteric Neck:     No masses; no thyromegaly Lungs:    Clear to auscultation bilaterally; normal respiratory effort CV:         Regular rate and rhythm; no murmurs Abd:      + bowel sounds; soft, non-tender; no palpable masses, no distension Ext:    No edema; adequate peripheral perfusion Skin:      Warm and dry; no rash Neuro: alert and oriented x 3 Psych: normal mood and affect  Data Reviewed: PFT's 04/30/2016 FVC: 3.89 ( 83%) FEV! 2.81  ( 80%) F/F 73 TLC 5.99 (85%) DLCO 69% DB response +25%  FENO 01/21/17- 49  CXR 01/25/2016: No Acute cardiopulmonary disease.  I have reviewed the images personally  EGD 07/13/16 Few gastric polyps, biopsy. Normal duodenum, GERD without esophagitis  Assessment:  Cough Likely from upper airway syndrome, postnasal drip. GERD. He is currently on Protonix. He also continues on Zyrtec, flonase. Symptoms are better with behavioral changes to cough including use of throat lozenges, sips of water, suppression of urge to cough.  Mild persistent asthma His PFTs shows  small airway disease with improvement post bronchodilator. He says that Qvar does not help and he has some intermittent chest pain from it.  FENO is high and he will need to be on a controller medication. Start breo. Reassess in 1-2 months. He will need CBC with diff and blood allergy profile, IgE.  Plan/Recommendations: - Start Breo - Check CBC with diff and blood allergy profile.  - Continue zyrtec, flonase,  protonix  Chilton Greathouse MD [facility] Pager [phone] 01/21/2017, 4:55 PM  CC: Daisy Floro, MD

## 2017-01-21 NOTE — Progress Notes (Signed)
Patient ID: Jeremy SaranRobert Glover, male   DOB: 07/01/1951, 66 y.o.   MRN: [id number] Patient seen in the office today and instructed on use of breo ellipta.  Patient expressed understanding and demonstrated technique.

## 2017-01-21 NOTE — Patient Instructions (Addendum)
We will start you on breo 200 Continue using your antiallergy medications Stop the qvar Check CBC with diff and blood allergy profile  Return to clinic in 1-2 months.

## 2017-01-24 LAB — RESPIRATORY ALLERGY PROFILE REGION II ~~LOC~~
ALLERGEN, CEDAR TREE, T6: 0.5 kU/L — AB
ALLERGEN, P. NOTATUM, M1: 0.1 kU/L — AB
ASPERGILLUS FUMIGATUS M3: 0.21 kU/L — AB
Allergen, A. alternata, m6: 2.94 kU/L — ABNORMAL HIGH
Allergen, Comm Silver Birch, t9: <0.1 kU/L
Allergen, Mulberry, t76: <0.1 kU/L
Allergen, Oak,t7: <0.1 kU/L
Box Elder IgE: <0.1 kU/L
COMMON RAGWEED: 0.85 kU/L — AB
Cat Dander: <0.1 kU/L
Cockroach: <0.1 kU/L
Dog Dander: <0.1 kU/L
Elm IgE: 0.21 kU/L — ABNORMAL HIGH
IgE (Immunoglobulin E), Serum: 109 kU/L (ref <115)
Johnson Grass: <0.1 kU/L
Rough Pigweed  IgE: <0.1 kU/L
Sheep Sorrel IgE: <0.1 kU/L

## 2017-03-29 ENCOUNTER — Ambulatory Visit: Payer: Managed Care, Other (non HMO) | Admitting: Pulmonary Disease

## 2017-03-31 ENCOUNTER — Encounter: Payer: Self-pay | Admitting: Pulmonary Disease

## 2017-03-31 ENCOUNTER — Ambulatory Visit (INDEPENDENT_AMBULATORY_CARE_PROVIDER_SITE_OTHER): Payer: Managed Care, Other (non HMO) | Admitting: Pulmonary Disease

## 2017-03-31 VITALS — BP 128/76 | HR 72 | Ht 70.0 in | Wt 167.0 lb

## 2017-03-31 DIAGNOSIS — R058 Other specified cough: Secondary | ICD-10-CM

## 2017-03-31 DIAGNOSIS — R05 Cough: Secondary | ICD-10-CM | POA: Diagnosis not present

## 2017-03-31 DIAGNOSIS — R059 Cough, unspecified: Secondary | ICD-10-CM

## 2017-03-31 DIAGNOSIS — J4530 Mild persistent asthma, uncomplicated: Secondary | ICD-10-CM

## 2017-03-31 NOTE — Progress Notes (Signed)
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 He has chronic cough for the past few years. This is nonproductive in nature, not associated with dyspnea, wheezing. He has daily symptoms. He wakes up in the morning coughing. He has issues of nasal discharge, postnasal drip and. He is being followed by Dr. Suszanne Conners, ENT who has started him on nasal spray and gabapentin.  His had an EGD which showed a few gastric polyps, GERD without esophagitis. Is now on Protonix. He was started on Qvar at the last visit. He does not notice any difference in symptoms however he reports intermittent atypical chest pain on using the Qvar.  Interim History: He is started on Breo at last visit and noticed slight improvement in dyspnea. He continues to exercise running up to 4 miles without any issue. He was evaluated by Dr. 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 He had a CT of the neck done with follow-up pending with ENT  Outpatient Encounter Prescriptions as of 03/31/2017  Medication Sig  . Albuterol Sulfate (PROAIR RESPICLICK) 108 (90 Base) MCG/ACT AEPB Inhale 2 puffs into the lungs every 6 (six) hours as needed (wheezing, shortness of breath).  . Ascorbic Acid (VITAMIN C) 1000 MG tablet Take 1,000 mg by mouth 2 (two) times daily.  . cetirizine (ZYRTEC) 10 MG tablet Take 10 mg by mouth 2 (two) times daily.  . fluticasone furoate-vilanterol (BREO ELLIPTA) 200-25 MCG/INH AEPB Inhale 1 puff into the lungs daily.  Marland Kitchen guaiFENesin (MUCINEX) 600 MG 12 hr tablet Take 600 mg by mouth daily.  . Multiple Vitamin (MULTIVITAMIN WITH MINERALS) TABS tablet  Take 1 tablet by mouth 2 (two) times daily.  . Omega-3 Fatty Acids (FISH OIL PO) Take 1 tablet by mouth daily.  . valsartan (DIOVAN) 80 MG tablet Take 80 mg by mouth daily.   No facility-administered encounter medications on file as of 03/31/2017.     Allergies as of 03/31/2017  . (No Known Allergies)    Past Medical History:  Diagnosis Date  . Elevated PSA   . Hypertension     Past Surgical History:  Procedure Laterality Date  . TONSILLECTOMY  age 24    Family History  Problem Relation Age of Onset  . Lung cancer Paternal Grandmother        smoker  . Other Father        Pulmonary Fibrosis, smoker  . Alzheimer's disease Mother     Social History   Social History  . Marital status: Married    Spouse name: N/A  . Number of children: N/A  . Years of education: N/A   Occupational History  . Not on file.   Social History Main Topics  . Smoking status: Never Smoker  . Smokeless tobacco: Never Used     Comment: socially in college  . Alcohol use 0.0 oz/week     Comment: 2 glasses of wine/beer w/ dinner  . Drug use: No  . Sexual activity: Not on file   Other Topics Concern  . 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  HENT: Negative.   Eyes: Negative for blurred vision.  Respiratory: as per HPI  Cardiovascular: Negative for chest pain and palpitations.  Gastrointestinal: Negative for vomiting, diarrhea, blood per rectum. Genitourinary: Negative for dysuria, urgency, frequency and hematuria.  Musculoskeletal: Negative for myalgias, back pain and joint pain.  Skin: Negative for itching and rash.  Neurological: Negative for dizziness, tremors, focal weakness, seizures and loss of consciousness.  Endo/Heme/Allergies: Negative  for environmental allergies.  Psychiatric/Behavioral: Negative for depression, suicidal ideas and hallucinations.  All other systems reviewed and are negative.   Physical Exam: Blood pressure 128/76, pulse 72, height 5\' 10"  (1.778 m), weight 167 lb (75.8 kg), SpO2 97 %. Gen:      No acute distress HEENT:  EOMI, sclera anicteric Neck:     No masses; no thyromegaly Lungs:    Clear to auscultation bilaterally; normal respiratory effort CV:         Regular rate and rhythm; no murmurs Abd:      + bowel sounds; soft, non-tender; no palpable masses, no distension Ext:    No edema; adequate peripheral perfusion Skin:      Warm and dry; no rash Neuro: alert and oriented x 3 Psych: normal mood and affect  Data Reviewed: PFT's 04/30/2016 FVC: 3.89 ( 83%) FEV1 2.81  ( 80%) FEF 25-75 percent 1.85 (66%], DB response +25% F/F 73 TLC 5.99 (85%) DLCO 69% Minimal diffusion defect, small airways disease Mild diffusion defect but corrects for alveolar volume  FENO 01/21/17- 49  CXR 01/25/2016: No Acute cardiopulmonary disease.  I have reviewed the images personally  EGD 07/13/16 Few gastric polyps, biopsy. Normal duodenum, GERD without esophagitis  CBC 01/21/17-WBC 5.9, eosinophils 5.5%, absolute eosinophil count 325 Blood allergy profile IgE 109, sensitive to ragweed, elm  Assessment:  Cough Likely from upper airway syndrome, postnasal drip. GERD, vocal cord paralysis. He is currently on Protonix. He also continues on Zyrtec, flonase. Follow up with ENT.  Mild persistent asthma His PFTs shows  small airway disease with improvement post bronchodilator. FENO is high. CBC shows elevated eosinophils  He was started on breo and is doing well on it. Continue the same  Plan/Recommendations: - Continue Breo - Follow up with ENT for vocal cord paralysis  Chilton GreathousePraveen Arianna Haydon MD [facility] Pager [phone] 03/31/2017, 4:50 PM  CC: Daisy Florooss, [name], MD

## 2017-03-31 NOTE — Patient Instructions (Signed)
Continue your breo as prescribed Follow-up with ENT  Follow-up in 6 months.

## 2017-08-07 ENCOUNTER — Other Ambulatory Visit: Payer: Self-pay | Admitting: Pulmonary Disease

## 2017-09-12 ENCOUNTER — Ambulatory Visit: Payer: Managed Care, Other (non HMO) | Admitting: Pulmonary Disease

## 2017-09-13 ENCOUNTER — Encounter: Payer: Self-pay | Admitting: Pulmonary Disease

## 2017-09-13 ENCOUNTER — Ambulatory Visit: Payer: Managed Care, Other (non HMO) | Admitting: Pulmonary Disease

## 2017-09-13 VITALS — BP 128/70 | HR 90 | Ht 70.0 in | Wt 166.2 lb

## 2017-09-13 DIAGNOSIS — J4530 Mild persistent asthma, uncomplicated: Secondary | ICD-10-CM | POA: Diagnosis not present

## 2017-09-13 DIAGNOSIS — R05 Cough: Secondary | ICD-10-CM | POA: Diagnosis not present

## 2017-09-13 DIAGNOSIS — R058 Other specified cough: Secondary | ICD-10-CM

## 2017-09-13 LAB — NITRIC OXIDE: NITRIC OXIDE: 28

## 2017-09-13 NOTE — Progress Notes (Signed)
Jeremy SaranRobert Glover    [id number]    04/09/1951  Primary Care Physician:Jeremy Glover, Jeremy Roundharles Alan, Jeremy Glover  Referring Physician: [name], Jeremy Alan, Jeremy Glover [address] [place], [place] [address]  Chief complaint:   Follow up for  Chronic cough Mild persistent asthma Right vocal cord paralysis  HPI: Jeremy Glover is a 66 year old with past medical history of hypertension. He has chronic cough for the past few years. This is nonproductive in nature, not associated with dyspnea, wheezing. He has daily symptoms. He wakes up in the morning coughing. He has issues of nasal discharge, postnasal drip and. He is being followed by Dr. Suszanne Connerseoh, ENT who has started him on nasal spray and gabapentin.  His had an EGD which showed a few gastric polyps, GERD without esophagitis. Is now on Protonix. He was started on Qvar at the last visit. He does not notice any difference in symptoms however he reports intermittent atypical chest pain on using the Qvar. He was evaluated by Dr. Delford FieldWright at ENT for hoarseness and noted to have right vocal cord paralysis of unclear etiology. He had a CT of the neck done with no abnormality  Interim History: He continues on the breo without any issue.  Reports that his wheezing is improved.  He has very occasional shortness of breath and hardly needs to use his rescue inhaler.  Outpatient Encounter Medications as of 09/13/2017  Medication Sig  . Albuterol Sulfate (PROAIR RESPICLICK) 108 (90 Base) MCG/ACT AEPB Inhale 2 puffs into the lungs every 6 (six) hours as needed (wheezing, shortness of breath).  . Ascorbic Acid (VITAMIN C) 1000 MG tablet Take 1,000 mg by mouth 2 (two) times daily.  Marland Kitchen. BREO ELLIPTA 200-25 MCG/INH AEPB INHALE 1 PUFF INTO THE LUNGS DAILY.  . cetirizine (ZYRTEC) 10 MG tablet Take 10 mg by mouth 2 (two) times daily.  Marland Kitchen. losartan (COZAAR) 50 MG tablet Take 50 mg daily by mouth.  . Multiple Vitamin (MULTIVITAMIN WITH MINERALS) TABS tablet Take 1 tablet by mouth 2 (two)  times daily.  . Omega-3 Fatty Acids (FISH OIL PO) Take 1 tablet by mouth daily.  . [DISCONTINUED] guaiFENesin (MUCINEX) 600 MG 12 hr tablet Take 600 mg by mouth daily.  . [DISCONTINUED] valsartan (DIOVAN) 80 MG tablet Take 80 mg by mouth daily.   No facility-administered encounter medications on file as of 09/13/2017.     Allergies as of 09/13/2017  . (No Known Allergies)    Past Medical History:  Diagnosis Date  . Elevated PSA   . Hypertension     Past Surgical History:  Procedure Laterality Date  . TONSILLECTOMY  age 89    Family History  Problem Relation Age of Onset  . Lung cancer Paternal Grandmother        smoker  . Other Father        Pulmonary Fibrosis, smoker  . Alzheimer's disease Mother     Social History   Socioeconomic History  . Marital status: Married    Spouse name: Not on file  . Number of children: Not on file  . Years of education: Not on file  . Highest education level: Not on file  Social Needs  . Financial resource strain: Not on file  . Food insecurity - worry: Not on file  . Food insecurity - inability: Not on file  . Transportation needs - medical: Not on file  . Transportation needs - non-medical: Not on file  Occupational History  . Not  on file  Tobacco Use  . Smoking status: Never Smoker  . Smokeless tobacco: Never Used  . Tobacco comment: socially in college  Substance and Sexual Activity  . Alcohol use: Yes    Alcohol/week: 0.0 oz    Comment: 2 glasses of wine/beer w/ dinner  . Drug use: No  . Sexual activity: Not on file  Other Topics Concern  . Not on file  Social History Narrative   Married, lives with spouse   2 grown Energy managerchildren   Director of Biomedical Engineering   Recent travel to [place] early [place]they own a house there)   Review of systems: Review of Systems  Constitutional: Negative for fever and chills.  HENT: Negative.   Eyes: Negative for blurred vision.  Respiratory: as per HPI  Cardiovascular:  Negative for chest pain and palpitations.  Gastrointestinal: Negative for vomiting, diarrhea, blood per rectum. Genitourinary: Negative for dysuria, urgency, frequency and hematuria.  Musculoskeletal: Negative for myalgias, back pain and joint pain.  Skin: Negative for itching and rash.  Neurological: Negative for dizziness, tremors, focal weakness, seizures and loss of consciousness.  Endo/Heme/Allergies: Negative for environmental allergies.  Psychiatric/Behavioral: Negative for depression, suicidal ideas and hallucinations.  All other systems reviewed and are negative.   Physical Exam: Blood pressure 128/70, pulse 90, height 5\' 10"  (1.778 m), weight 166 lb 3.2 oz (75.4 kg), SpO2 95 %. Gen:      No acute distress HEENT:  EOMI, sclera anicteric Neck:     No masses; no thyromegaly Lungs:    Clear to auscultation bilaterally; normal respiratory effort CV:         Regular rate and rhythm; no murmurs Abd:      + bowel sounds; soft, non-tender; no palpable masses, no distension Ext:    No edema; adequate peripheral perfusion Skin:      Warm and dry; no rash Neuro: alert and oriented x 3 Psych: normal mood and affect  Data Reviewed: PFT's 04/30/2016 FVC: 3.89 ( 83%) FEV1 2.81  ( 80%) FEF 25-75 percent 1.85 (66%], DB response +25% F/F 73 TLC 5.99 (85%) DLCO 69% Minimal diffusion defect, small airways disease Mild diffusion defect but corrects for alveolar volume  FENO 01/21/17- 49 FENO 09/13/17- 28  CXR 01/25/2016: No acute cardiopulmonary disease.  I have reviewed the images personally  EGD 07/13/16 Few gastric polyps, biopsy. Normal duodenum, GERD without esophagitis  CBC 01/21/17-WBC 5.9, eosinophils 5.5%, absolute eosinophil count 325 Blood allergy profile IgE 109, sensitive to ragweed, elm  Assessment:  Cough Likely from upper airway syndrome, postnasal drip. GERD, vocal cord paralysis. He is currently on Protonix. He also continues on Zyrtec, flonase. Follow up with Dr.  Delford FieldWright, ENT.  Mild persistent asthma His PFTs shows  small airway disease with improvement post bronchodilator. FENO is high. CBC shows elevated eosinophils  He was started on breo and is doing well on it.  Repeat FENO shows improvement.  Continue the same  Plan/Recommendations: - Continue Breo - Follow up with ENT for vocal cord paralysis  Chilton GreathousePraveen Janeann Paisley Jeremy Glover [facility] Pager [phone] 09/13/2017, 3:56 PM  CC: Jeremy Glover, Jeremy Alan, Jeremy Glover

## 2017-09-13 NOTE — Patient Instructions (Addendum)
Continue using your Breo as prescribed We will follow you back in clinic in 6 months

## 2017-12-18 DIAGNOSIS — J4531 Mild persistent asthma with (acute) exacerbation: Secondary | ICD-10-CM | POA: Diagnosis not present

## 2017-12-18 DIAGNOSIS — R0602 Shortness of breath: Secondary | ICD-10-CM | POA: Diagnosis present

## 2017-12-18 DIAGNOSIS — Z79899 Other long term (current) drug therapy: Secondary | ICD-10-CM | POA: Insufficient documentation

## 2017-12-18 DIAGNOSIS — I10 Essential (primary) hypertension: Secondary | ICD-10-CM | POA: Insufficient documentation

## 2017-12-18 DIAGNOSIS — R0789 Other chest pain: Secondary | ICD-10-CM | POA: Diagnosis not present

## 2017-12-18 NOTE — ED Triage Notes (Addendum)
Pt brought in by EMS from home with  c/o shortness of breath  Pt reported to them that he was going up his stairs and felt out of breath so he put his oxygen monitor on and it showed his heart beat was irregular and he had some tightness in his chest  Pt states he has had indigestion tonight so he took Tums and some liquid antiacid   EMS reports pt was anxious  Denies chest pain  EMS did a 12 lead and states it was unremarkable  Oxygen placed on pt at 2 liters/min via [place] for comfort

## 2017-12-19 ENCOUNTER — Emergency Department (HOSPITAL_COMMUNITY)
Admission: EM | Admit: 2017-12-19 | Discharge: 2017-12-19 | Disposition: A | Discharge status: Home / Self Care | Payer: Medicare Other | Attending: Emergency Medicine | Admitting: Emergency Medicine

## 2017-12-19 ENCOUNTER — Emergency Department (HOSPITAL_COMMUNITY): Payer: Medicare Other

## 2017-12-19 ENCOUNTER — Encounter (HOSPITAL_COMMUNITY): Payer: Self-pay | Admitting: Emergency Medicine

## 2017-12-19 ENCOUNTER — Other Ambulatory Visit: Payer: Self-pay

## 2017-12-19 DIAGNOSIS — R062 Wheezing: Secondary | ICD-10-CM

## 2017-12-19 DIAGNOSIS — R0602 Shortness of breath: Secondary | ICD-10-CM

## 2017-12-19 DIAGNOSIS — R0789 Other chest pain: Secondary | ICD-10-CM

## 2017-12-19 LAB — BASIC METABOLIC PANEL
Anion gap: 8 (ref 5–15)
BUN: 13 mg/dL (ref 6–20)
CO2: 30 mmol/L (ref 22–32)
Calcium: 10 mg/dL (ref 8.9–10.3)
Chloride: 103 mmol/L (ref 101–111)
Creatinine, Ser: 0.85 mg/dL (ref 0.61–1.24)
Glucose, Bld: 99 mg/dL (ref 65–99)
POTASSIUM: 4.4 mmol/L (ref 3.5–5.1)
SODIUM: 141 mmol/L (ref 135–145)

## 2017-12-19 LAB — I-STAT TROPONIN, ED
TROPONIN I, POC: 0 ng/mL (ref 0.00–0.08)
Troponin i, poc: 0 ng/mL (ref 0.00–0.08)

## 2017-12-19 LAB — CBC
HEMATOCRIT: 45.2 % (ref 39.0–52.0)
HEMOGLOBIN: 15.5 g/dL (ref 13.0–17.0)
MCH: 31.2 pg (ref 26.0–34.0)
MCHC: 34.3 g/dL (ref 30.0–36.0)
MCV: 90.9 fL (ref 78.0–100.0)
Platelets: 260 10*3/uL (ref 150–400)
RBC: 4.97 MIL/uL (ref 4.22–5.81)
RDW: 13.4 % (ref 11.5–15.5)
WBC: 5.6 10*3/uL (ref 4.0–10.5)

## 2017-12-19 MED ORDER — ASPIRIN 81 MG PO CHEW
324.0000 mg | CHEWABLE_TABLET | Freq: Once | ORAL | Status: AC
  Administered 2017-12-19: 324 mg via ORAL
  Filled 2017-12-19: qty 4

## 2017-12-19 MED ORDER — DEXAMETHASONE 4 MG PO TABS
10.0000 mg | ORAL_TABLET | Freq: Once | ORAL | Status: AC
  Administered 2017-12-19: 10 mg via ORAL
  Filled 2017-12-19: qty 2

## 2017-12-19 MED ORDER — ALBUTEROL SULFATE 108 (90 BASE) MCG/ACT IN AEPB: 2.0000 | INHALATION_SPRAY | RESPIRATORY_TRACT | 0 refills | Status: DC | PRN

## 2017-12-19 MED ORDER — IPRATROPIUM-ALBUTEROL 0.5-2.5 (3) MG/3ML IN SOLN
3.0000 mL | Freq: Once | RESPIRATORY_TRACT | Status: AC
  Administered 2017-12-19: 3 mL via RESPIRATORY_TRACT
  Filled 2017-12-19: qty 3

## 2017-12-19 NOTE — ED Notes (Addendum)
Pt reports feeling much better than on arrival. When getting pt changed into hospital gown, this writer noted redness on pt's chest/abdomen that pt stated is abnormal.

## 2017-12-19 NOTE — Discharge Instructions (Signed)
Follow up with the cardiology office to arrange a stress test. Return if you have any chest discomfort which does not respond to the inhaler.

## 2017-12-19 NOTE — ED Provider Notes (Signed)
[facility]-EMERGENCY DEPT Provider Note   CSN: [id number] Arrival date & time: 12/18/17  2344     History   Chief Complaint Chief Complaint  Patient presents with  . Shortness of Breath    HPI Jeremy Glover is a 67 y.o. male.  The history is provided by the patient.  Shortness of Breath   He had onset this evening of shortness of breath and a tight feeling in his chest after climbing a flight of stairs which included approximately 20 steps.  There is no associated nausea or diaphoresis.  He has a pulse oximeter which she checked and it stated that his heart rate was fast and it could not detect pulse oximetry.  Symptoms lasted until he got an ambulance and had oxygen applied.  He rated his tightness at 5/10.  Nothing seemed to make it better or other than oxygen, nothing seemed to make it worse.  He denies any recent fatigue or decreased exercise tolerance.  He does have cardiac risk factors of hypertension but denies tobacco use, diabetes, hyperlipidemia and denies family history of premature coronary atherosclerosis.  Past Medical History:  Diagnosis Date  . Elevated PSA   . Hypertension     Patient Active Problem List   Diagnosis Date Noted  . Mild persistent asthma 01/21/2017  . Upper airway cough syndrome 04/30/2016    Past Surgical History:  Procedure Laterality Date  . TONSILLECTOMY  age 35       Home Medications    Prior to Admission medications   Medication Sig Start Date End Date Taking? Authorizing Provider  Ascorbic Acid (VITAMIN C) 1000 MG tablet Take 1,000 mg by mouth daily.    Yes [provider]  BREO ELLIPTA 200-25 MCG/INH AEPB INHALE 1 PUFF INTO THE LUNGS DAILY. 08/08/17  Yes Mannam, Praveen, MD  cetirizine (ZYRTEC) 10 MG tablet Take 10 mg by mouth daily.    Yes [provider]  Multiple Vitamin (MULTIVITAMIN WITH MINERALS) TABS tablet Take 1 tablet by mouth daily.    Yes [provider]  Omega-3  Fatty Acids (FISH OIL PO) Take 1 tablet by mouth daily.   Yes [provider]  valsartan (DIOVAN) 80 MG tablet Take 80 mg by mouth daily.   Yes [provider]  Albuterol Sulfate (PROAIR RESPICLICK) 108 (90 Base) MCG/ACT AEPB Inhale 2 puffs into the lungs every 6 (six) hours as needed (wheezing, shortness of breath). Patient not taking: Reported on 12/19/2017 01/27/16   Chilton Greathouse, MD    Family History Family History  Problem Relation Age of Onset  . Lung cancer Paternal Grandmother        smoker  . Other Father        Pulmonary Fibrosis, smoker  . Alzheimer's disease Mother     Social History Social History   Tobacco Use  . Smoking status: Never Smoker  . Smokeless tobacco: Never Used  . Tobacco comment: socially in college  Substance Use Topics  . Alcohol use: Yes    Alcohol/week: 0.0 oz    Comment: 2 glasses of wine/beer w/ dinner  . Drug use: No     Allergies   Patient has no known allergies.   Review of Systems Review of Systems  Respiratory: Positive for shortness of breath.   All other systems reviewed and are negative.    Physical Exam Updated Vital Signs BP 109/72 (BP Location: Left Arm)   Pulse 74   Temp 98.4 F (36.9 C) (Oral)  Resp 15   SpO2 97%   Physical Exam  Nursing note and vitals reviewed.  67 year old male, resting comfortably and in no acute distress. Vital signs are normal. Oxygen saturation is 97%, which is normal. Head is normocephalic and atraumatic. PERRLA, EOMI. Oropharynx is clear. Neck is nontender and supple without adenopathy or JVD. Back is nontender and there is no CVA tenderness. Lungs have a slightly prolonged exhalation phase with no overt rales, wheezes, rhonchi.  However, with forced exhalation, mild wheezing is noted. Chest is nontender. Heart has regular rate and rhythm without murmur. Abdomen is soft, flat, nontender without masses or hepatosplenomegaly and peristalsis is  normoactive. Extremities have no cyanosis or edema, full range of motion is present. Skin is warm and dry without rash. Neurologic: Mental status is normal, cranial nerves are intact, there are no motor or sensory deficits.  ED Treatments / Results  Labs (all labs ordered are listed, but only abnormal results are displayed) Labs Reviewed  BASIC METABOLIC PANEL  CBC  I-STAT TROPONIN, ED  I-STAT TROPONIN, ED    EKG  EKG Interpretation  Date/Time:  Sunday December 18 2017 23:58:30 EST Ventricular Rate:  103 PR Interval:    QRS Duration: 92 QT Interval:  332 QTC Calculation: 435 R Axis:   -17 Text Interpretation:  Sinus tachycardia Borderline left axis deviation RSR' in V1 or V2, right VCD or RVH When compared with ECG of 01/25/2016, No significant change was found Confirmed by Dione Booze ([id number]) on 12/19/2017 12:21:12 AM       Radiology Dg Chest 2 View  Result Date: 12/19/2017 CLINICAL DATA:  Irregular heart beat, chest tightness EXAM: CHEST  2 VIEW COMPARISON:  01/25/2016 FINDINGS: The heart size and mediastinal contours are within normal limits. Both lungs are clear. The visualized skeletal structures are unremarkable. IMPRESSION: No active cardiopulmonary disease. Electronically Signed   By: Jasmine Pang M.D.   On: 12/19/2017 01:40    Procedures Procedures (including critical care time)  Medications Ordered in ED Medications  ipratropium-albuterol (DUONEB) 0.5-2.5 (3) MG/3ML nebulizer solution 3 mL (3 mLs Nebulization Given 12/19/17 0421)  aspirin chewable tablet 324 mg (324 mg Oral Given 12/19/17 0421)  dexamethasone (DECADRON) tablet 10 mg (10 mg Oral Given 12/19/17 0523)     Initial Impression / Assessment and Plan / ED Course  I have reviewed the triage vital signs and the nursing notes.  Pertinent labs & imaging results that were available during my care of the patient were reviewed by me and considered in my medical decision making (see chart for  details).  Episode of dyspnea which probably is secondary to bronchospasm.  Initial ECG, troponin, chest x-ray are all unremarkable.  He will be given a dose of aspirin and given an albuterol with ipratropium nebulizer treatment.  We will need to check delta troponin.  Old records are reviewed and he has outpatient visits for mild persistent asthma.  Repeat troponin is undetectable.  He had significant improvement with albuterol with ipratropium.  On reexam, there is still minimal wheezing noted with forced exhalation.  He now tells me he had another episode where he got short of breath and chest tightness few days ago.  That was when he was running in a parking lot returning a shopping cart.  Given 2 episodes of exertional chest discomfort and dyspnea, will refer to cardiology for consideration for outpatient stress testing.  He stiffened given a dose of dexamethasone and discharged with a prescription for albuterol inhaler.  Final Clinical Impressions(s) / ED Diagnoses   Final diagnoses:  Shortness of breath  Chest tightness  Wheezing    ED Discharge Orders        Ordered    Albuterol Sulfate (PROAIR RESPICLICK) 108 (90 Base) MCG/ACT AEPB  Every 4 hours PRN     12/19/17 0519    Ambulatory referral to Cardiology    Comments:  Evaluation for possible stress testing   12/19/17 0519       Dione BoozeGlick, Jency Schnieders, MD 12/19/17 [phone]

## 2017-12-20 ENCOUNTER — Ambulatory Visit: Payer: Medicare Other | Admitting: Cardiology

## 2017-12-20 ENCOUNTER — Encounter: Payer: Self-pay | Admitting: Cardiology

## 2017-12-20 VITALS — BP 116/68 | HR 70 | Ht 70.5 in | Wt 164.8 lb

## 2017-12-20 DIAGNOSIS — R002 Palpitations: Secondary | ICD-10-CM | POA: Diagnosis not present

## 2017-12-20 DIAGNOSIS — I10 Essential (primary) hypertension: Secondary | ICD-10-CM | POA: Diagnosis not present

## 2017-12-20 DIAGNOSIS — R079 Chest pain, unspecified: Secondary | ICD-10-CM | POA: Diagnosis not present

## 2017-12-20 NOTE — Patient Instructions (Addendum)
No medication changes    TEST SCHEDULE AT [address] Your physician has requested that you have en exercise stress myoview. For further information please visit [email or web address]. Please follow instruction sheet, as given.    RECOMMENDATION  DR Herbie BaltimoreHARDING RECOMMENDS THAT YOU  PURCHASES  " Kardia" [employer] FROM THE  [employer]  APP PLAY STORE.   THE APP IS FREE , BUT THE  EQUIPMENT HAS A COST. IT IS A WAY FOR YOU TO OBTAIN A RECORDING OF YOUR HEART RATE . IT WILL BE A SHORT RHYTHM  STRIP THAT YOU CAN SHOW TO MEDICAL STAFF.     Your physician recommends that you schedule a follow-up appointment in 1 MONTH WITH DR HARDING.   Cardiac Nuclear Scan A cardiac nuclear scan is a test that measures blood flow to the heart when a person is resting and when he or she is exercising. The test looks for problems such as:  Not enough blood reaching a portion of the heart.  The heart muscle not working normally.  You may need this test if:  You have heart disease.  You have had abnormal lab results.  You have had heart surgery or angioplasty.  You have chest pain.  You have shortness of breath.  In this test, a radioactive dye (tracer) is injected into your bloodstream. After the tracer has traveled to your heart, an imaging device is used to measure how much of the tracer is absorbed by or distributed to various areas of your heart. This procedure is usually done at a hospital and takes 2-4 hours. Tell a health care provider about:  Any allergies you have.  All medicines you are taking, including vitamins, herbs, eye drops, creams, and over-the-counter medicines.  Any problems you or family members have had with the use of anesthetic medicines.  Any blood disorders you have.  Any surgeries you have had.  Any medical conditions you have.  Whether you are pregnant or may be pregnant. What are the risks? Generally, this is a safe procedure. However,  problems may occur, including:  Serious chest pain and heart attack. This is only a risk if the stress portion of the test is done.  Rapid heartbeat.  Sensation of warmth in your chest. This usually passes quickly.  What happens before the procedure?  Ask your health care provider about changing or stopping your regular medicines. This is especially important if you are taking diabetes medicines or blood thinners.  Remove your jewelry on the day of the procedure. What happens during the procedure?  An IV tube will be inserted into one of your veins.  Your health care provider will inject a small amount of radioactive tracer through the tube.  You will wait for 20-40 minutes while the tracer travels through your bloodstream.  Your heart activity will be monitored with an electrocardiogram (ECG).  You will lie down on an exam table.  Images of your heart will be taken for about 15-20 minutes.  You may be asked to exercise on a treadmill or stationary bike. While you exercise, your heart's activity will be monitored with an ECG, and your blood pressure will be checked. If you are unable to exercise, you may be given a medicine to increase blood flow to parts of your heart.  When blood flow to your heart has peaked, a tracer will again be injected through the IV tube.  After 20-40 minutes, you will get back on the exam table  and have more images taken of your heart.  When the procedure is over, your IV tube will be removed. The procedure may vary among health care providers and hospitals. Depending on the type of tracer used, scans may need to be repeated 3-4 hours later. What happens after the procedure?  Unless your health care provider tells you otherwise, you may return to your normal schedule, including diet, activities, and medicines.  Unless your health care provider tells you otherwise, you may increase your fluid intake. This will help flush the contrast dye from your  body. Drink enough fluid to keep your urine clear or pale yellow.  It is up to you to get your test results. Ask your health care provider, or the department that is doing the test, when your results will be ready. Summary  A cardiac nuclear scan measures the blood flow to the heart when a person is resting and when he or she is exercising.  You may need this test if you are at risk for heart disease.  Tell your health care provider if you are pregnant.  Unless your health care provider tells you otherwise, increase your fluid intake. This will help flush the contrast dye from your body. Drink enough fluid to keep your urine clear or pale yellow. This information is not intended to replace advice given to you by your health care provider. Make sure you discuss any questions you have with your health care provider. Document Released: 11/12/2004 Document Revised: 10/20/2016 Document Reviewed: 09/26/2013 Elsevier Interactive Patient Education  2017 [employer].

## 2017-12-20 NOTE — Progress Notes (Signed)
PCP: Daisy Floro, MD  Clinic Note: Chief Complaint  Patient presents with  . New Patient (Initial Visit)    ER follow-up for chest pain    HPI:  Jeremy Glover is a 67 y.o. 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  He also has Well-controlled hypertension with a family history only of hypertension.  No family history of premature coronary disease.  Jeremy Glover went to [facility] ER on February 17 with complaints of chest tightness and shortness of breath.  He really had 2 episodes of symptoms (first episode day before), and it was after the second episode that he went to the emergency room.  Recent Hospitalizations: See above  Studies Personally Reviewed - (if available, images/films reviewed: From Epic Chart or Care Everywhere)  None  Interval History: Jeremy Glover presents here today S the episodes that led to his ER visit, but there is also noted persistent symptoms since then.  Basically on Saturday, February 18, he was at the grocery store with his wife shopping.  As his usual he was taken the cart back to the drop-off spot and likes to run with it to get some exercise.  Usually he is able to this up without any difficulty, but by the time he got back to his car, he felt as though his heart would never slow down he felt tightness and pressure in his chest and very difficult for him to breathe.  He just felt weak and tired.  After a few minutes symptoms settle down, so he went home and just took it easy.  The next day he went up and down the stairs in his house to get something and had to do it a second time.  Doing this he just felt a sense of tightness and heaviness in his chest with dyspnea associated with  his heart rate going up much higher than it usually would do for this type of activity.  He felt like as though he could not feel his pulse, so his wife brought their pulse oximeter and it would not pick up his pulse or pulse ox.  At that point he got concerned so he called EMS.  Upon EMS arrival his heart rate was still up, but not in any type of arrhythmia in the but the 100 110 range.  He felt better with oxygen and the discomfort in his chest and fast heart rate spell resolved relatively spontaneously.  Since that day after he got up, after going home, he is noted that he really has not done much of anything, but has noted some tightness in his chest and dyspnea with doing minor exertion that for him would never cause problems in the past.  He has not, however felt the fast heart rate spells and significant dyspnea. He has not had any resting chest discomfort or dyspnea nor has he had any PND, orthopnea or edema. Except for those 2 episodes of fast heart rate spells, no further palpitations or rapid/irregular heartbeats. While he did feel somewhat dizzy and as if he may faint during the 2 major episodes noted, he has not had any further syncope or near syncope type symptoms.  No TIA or amaurosis fugax.  No melena, hematochezia,  hematuria, or epstaxis. No claudication.  ROS: A comprehensive was performed.  Pertinent symptoms noted in HPI Review of Systems  Constitutional: Negative for malaise/fatigue.  HENT: Negative for congestion (But does have postnasal drip).   Respiratory: Positive for cough, shortness of breath (Per HPI) and wheezing (Relatively controlled). Negative for sputum production.   Gastrointestinal: Negative for blood in stool, constipation and melena.  Genitourinary: Negative for hematuria.  Musculoskeletal: Negative for falls, joint pain and myalgias.  Neurological: Positive for dizziness (HPI). Negative for weakness.  Endo/Heme/Allergies: Positive for environmental allergies.   Psychiatric/Behavioral: Negative for memory loss. The patient is not nervous/anxious and does not have insomnia.   All other systems reviewed and are negative.  I have reviewed and (if needed) personally updated the patient's problem list, medications, allergies, past medical and surgical history, social and family history.   Past Medical History:  Diagnosis Date  . Elevated PSA   . Hypertension     Past Surgical History:  Procedure Laterality Date  . TONSILLECTOMY  age 39    Current Meds  Medication Sig  . Albuterol Sulfate (PROAIR RESPICLICK) 108 (90 Base) MCG/ACT AEPB Inhale 2 puffs into the lungs every 4 (four) hours as needed (wheezing, shortness of breath).  . Ascorbic Acid (VITAMIN C) 1000 MG tablet Take 1,000 mg by mouth daily.   Marland Kitchen aspirin 81 MG tablet Take 81 mg by mouth daily.  Marland Kitchen BREO ELLIPTA 200-25 MCG/INH AEPB INHALE 1 PUFF INTO THE LUNGS DAILY.  . cetirizine (ZYRTEC) 10 MG tablet Take 10 mg by mouth daily.   . Multiple Vitamin (MULTIVITAMIN WITH MINERALS) TABS tablet Take 1 tablet by mouth daily.   . Omega-3 Fatty Acids (FISH OIL PO) Take 1 tablet by mouth daily.  . valsartan (DIOVAN) 80 MG tablet Take 80 mg by mouth daily.    No Known Allergies  Social History   Tobacco Use  . Smoking status: Never Smoker  . Smokeless tobacco: Never Used  . Tobacco comment: socially in college  Substance Use Topics  . Alcohol use: Yes    Alcohol/week: 0.0 oz    Comment: 2 glasses of wine/beer w/ dinner  . Drug use: No   Social History   Social History Narrative   Married, lives with spouse   2 grown Energy manager   Recent travel to [place] early March (they own a house there)   He is very active in the warmer weather he spends time exercising on his bicycle.  He he also walks routinely and has no problems going up and down stairs at baseline.   He also has joined the [employer].    family history includes Alzheimer's  disease in his mother; Lung cancer in his paternal grandmother; Other in his father.  Wt Readings from Last 3 Encounters:  12/20/17 164 lb 12.8 oz (74.8 kg)  09/13/17 166 lb 3.2 oz (75.4 kg)  03/31/17 167 lb (75.8 kg)    PHYSICAL EXAM BP 116/68   Pulse 70   Ht 5' 10.5" (1.791 m)   Wt 164 lb 12.8 oz (74.8 kg)   BMI 23.31 kg/m  Physical Exam  Constitutional: He is oriented to person, place, and time. He appears well-developed and well-nourished. No distress.  Healthy-appearing.  Well-groomed.  HENT:  Head: Normocephalic and atraumatic.  Mouth/Throat: No oropharyngeal exudate.  Eyes: Conjunctivae and EOM are normal. Pupils are equal, round, and reactive to light. No scleral icterus.  Neck: Normal range of motion. Neck  supple. No hepatojugular reflux and no JVD present. Carotid bruit is not present. No tracheal deviation present. No thyromegaly present.  Cardiovascular: Normal rate, regular rhythm, normal heart sounds, intact distal pulses and normal pulses.  No extrasystoles are present. PMI is not displaced. Exam reveals no gallop and no friction rub.  No murmur heard. Pulmonary/Chest: Effort normal and breath sounds normal. No respiratory distress. He has no wheezes. He has no rales. He exhibits no tenderness.  Abdominal: Soft. Bowel sounds are normal. He exhibits no distension. There is no tenderness. There is no rebound.  Musculoskeletal: Normal range of motion. He exhibits no edema.  Neurological: He is alert and oriented to person, place, and time. No cranial nerve deficit.  Skin: Skin is warm and dry. He is not diaphoretic.  Mild spider/varicose veins in his ankles  Psychiatric: He has a normal mood and affect. His behavior is normal. Judgment and thought content normal.  Nursing note and vitals reviewed.    Adult ECG Report not checked today. From February 17 -sinus tachycardia, rate 103.  RSR prime noted in precordial leads, but otherwise relatively normal EKG.  No ischemic  changes.   Other studies Reviewed: Additional studies/ records that were reviewed today include:  Recent Labs:   Lab Results  Component Value Date   WBC 5.6 12/19/2017   HGB 15.5 12/19/2017   HCT 45.2 12/19/2017   MCV 90.9 12/19/2017   PLT 260 12/19/2017   Lab Results  Component Value Date   CREATININE 0.85 12/19/2017   BUN 13 12/19/2017   NA 141 12/19/2017   K 4.4 12/19/2017   CL 103 12/19/2017   CO2 30 12/19/2017   No results found for: TSH No results found for: CHOL, HDL, LDLCALC, LDLDIRECT, TRIG, CHOLHDL  ASSESSMENT / PLAN: Problem List Items Addressed This Visit    Chest pain with moderate risk for cardiac etiology - Primary    His 2 episodes of sound quite concerning for possible exertional angina symptoms.  He is an otherwise relatively healthy gentleman with only hypertension as a risk factor.  No family history.  I am not aware of his lipid levels as I do not have any available to review.  My concerns about his symptoms are could not be an arrhythmia such as A. fib since he was not able to get a pulse ox reading, or could this simply just be angina or combination of both. Plan: Treadmill Myoview, Pending results will probably need to consider statin and beta-blocker.  We also talked about the rapid heartbeat episodes associated with the chest discomfort and I think that this is some that warrants evaluation, but we may not pick anything up on a monitor.  He will try the " Lourena [employer]ardia" [employer] smart phone application.      Relevant Orders   MYOCARDIAL PERFUSION IMAGING   Essential hypertension (Chronic)    Well-controlled on Diovan.  May be adjust regimen based on what we see on Myoview      Relevant Medications   aspirin 81 MG tablet   Rapid palpitations    He had at least one episode that sounded like it may very well of been A. fib or SVT on Saturday when he was at the grocery store as well as a second episode when they could not feel a pulse or get a pulse ox  reading.  That is somewhat times a classic finding for A. fib.  We will get assessment of his cardiac function with Myoview, but  low threshold for echocardiogram.  I did not hear any abnormal heart sounds.  Recommend " Kardia" [employer] application for portable monitor.  We are unlikely to capture anything if he wears a event monitor since he is not had any further episodes and had not had any prior to this.      Relevant Orders   MYOCARDIAL PERFUSION IMAGING      Current medicines are reviewed at length with the patient today. (+/- concerns) n/a The following changes have been made: n/a  Patient Instructions  No medication changes    TEST SCHEDULE AT [address] Your physician has requested that you have en exercise stress myoview. For further information please visit [email or web address] Please follow instruction sheet, as given.  RECOMMENDATION  DR Herbie Baltimore RECOMMENDS THAT YOU  PURCHASES  " Kardia" By [employer]. FROM THE  GOOGLE/[employer]  APP PLAY STORE.   THE APP IS FREE , BUT THE  EQUIPMENT HAS A COST. IT IS A WAY FOR YOU TO OBTAIN A RECORDING OF YOUR HEART RATE . IT WILL BE A SHORT RHYTHM  STRIP THAT YOU CAN SHOW TO MEDICAL STAFF.   Your physician recommends that you schedule a follow-up appointment in 1 MONTH WITH DR Lyndsay Talamante.   Studies Ordered:   Orders Placed This Encounter  Procedures  . MYOCARDIAL PERFUSION IMAGING      Bryan Lemma, M.D., M.S. Interventional Cardiologist   Pager # [phone] Phone # [phone] [address] [place], [place] [address]   Thank you for choosing Heart[facility] at [facility]!!

## 2017-12-21 ENCOUNTER — Encounter: Payer: Self-pay | Admitting: Cardiology

## 2017-12-21 NOTE — Assessment & Plan Note (Addendum)
His 2 episodes of sound quite concerning for possible exertional angina symptoms.  He is an otherwise relatively healthy gentleman with only hypertension as a risk factor.  No family history.  I am not aware of his lipid levels as I do not have any available to review.  My concerns about his symptoms are could not be an arrhythmia such as A. fib since he was not able to get a pulse ox reading, or could this simply just be angina or combination of both. Plan: Treadmill Myoview, Pending results will probably need to consider statin and beta-blocker.  We also talked about the rapid heartbeat episodes associated with the chest discomfort and I think that this is some that warrants evaluation, but we may not pick anything up on a monitor.  He will try the " Lourena Sim[employer]sKardia" [employer] smart phone application.

## 2017-12-21 NOTE — Assessment & Plan Note (Signed)
Well-controlled on Diovan.  May be adjust regimen based on what we see on Myoview

## 2017-12-21 NOTE — Assessment & Plan Note (Signed)
He had at least one episode that sounded like it may very well of been A. fib or SVT on Saturday when he was at the grocery store as well as a second episode when they could not feel a pulse or get a pulse ox reading.  That is somewhat times a classic finding for A. fib.  We will get assessment of his cardiac function with Myoview, but low threshold for echocardiogram.  I did not hear any abnormal heart sounds.  Recommend " Kardia[employer] smart phone application for portable monitor.  We are unlikely to capture anything if he wears a event monitor since he is not had any further episodes and had not had any prior to this.

## 2018-01-03 ENCOUNTER — Telehealth (HOSPITAL_COMMUNITY): Payer: Self-pay

## 2018-01-03 NOTE — Telephone Encounter (Signed)
Encounter complete. 

## 2018-01-05 ENCOUNTER — Ambulatory Visit (HOSPITAL_COMMUNITY)
Admission: RE | Admit: 2018-01-05 | Discharge: 2018-01-05 | Disposition: A | Discharge status: Home / Self Care | Payer: Medicare Other | Source: Ambulatory Visit | Attending: Cardiovascular Disease | Admitting: Cardiovascular Disease

## 2018-01-05 DIAGNOSIS — R079 Chest pain, unspecified: Secondary | ICD-10-CM | POA: Diagnosis not present

## 2018-01-05 DIAGNOSIS — R002 Palpitations: Secondary | ICD-10-CM | POA: Insufficient documentation

## 2018-01-05 LAB — MYOCARDIAL PERFUSION IMAGING
CHL CUP NUCLEAR SDS: 1
CHL CUP NUCLEAR SRS: 2
CHL CUP RESTING HR STRESS: 68 {beats}/min
CSEPEW: 13.4 METS
CSEPHR: 101 %
CSEPPHR: 157 {beats}/min
Exercise duration (min): 12 min
Exercise duration (sec): 0 s
LVDIAVOL: 54 mL (ref 62–150)
LVSYSVOL: 111 mL
MPHR: 155 {beats}/min
RPE: 16
SSS: 3
TID: 1.09

## 2018-01-05 MED ORDER — TECHNETIUM TC 99M TETROFOSMIN IV KIT
10.9000 | PACK | Freq: Once | INTRAVENOUS | Status: AC | PRN
  Administered 2018-01-05: 10.9 via INTRAVENOUS
  Filled 2018-01-05: qty 11

## 2018-01-05 MED ORDER — TECHNETIUM TC 99M TETROFOSMIN IV KIT
30.8000 | PACK | Freq: Once | INTRAVENOUS | Status: AC | PRN
  Administered 2018-01-05: 30.8 via INTRAVENOUS
  Filled 2018-01-05: qty 31

## 2018-01-28 ENCOUNTER — Other Ambulatory Visit: Payer: Self-pay | Admitting: Pulmonary Disease

## 2018-01-30 ENCOUNTER — Encounter: Payer: Self-pay | Admitting: Cardiology

## 2018-01-30 ENCOUNTER — Ambulatory Visit: Payer: Medicare Other | Admitting: Cardiology

## 2018-01-30 VITALS — BP 121/78 | HR 75 | Ht 70.0 in | Wt 164.2 lb

## 2018-01-30 DIAGNOSIS — R002 Palpitations: Secondary | ICD-10-CM

## 2018-01-30 DIAGNOSIS — R079 Chest pain, unspecified: Secondary | ICD-10-CM

## 2018-01-30 DIAGNOSIS — I10 Essential (primary) hypertension: Secondary | ICD-10-CM

## 2018-01-30 NOTE — Patient Instructions (Addendum)
Medication Instructions:  No medication changes    Follow-Up: Your physician wants you to follow-up in: 6 months with Dr. Herbie BaltimoreHarding. You will receive a reminder letter in the mail two months in advance. If you don't receive a letter, please call our office to schedule the follow-up appointment.   Any Other Special Instructions Will Be Listed Below (If Applicable). Recommendations for vagal maneuvers:  "Bearing down"  Coughing  Gagging  Icy, cold towel on face or drink ice cold water Resume all activities Avoid Caffeine Stay Hydrated- drink at least 8-10 glasses of water a day (urine should be clear)   DR HARDING RECOMMENDS THAT YOU  PURCHASES  " Kardia" By [employer]. FROM THE  GOOGLE/[employer]  APP PLAY STORE.   THE APP IS FREE , BUT THE  EQUIPMENT HAS A COST. IT IS A WAY FOR YOU TO OBTAIN A RECORDING OF YOUR HEART RATE . IT WILL BE A SHORT RHYTHM  STRIP THAT YOU CAN SHOW TO MEDICAL STAFF.   If you need a refill on your cardiac medications before your next appointment, please call your pharmacy.

## 2018-01-30 NOTE — Progress Notes (Signed)
PCP: Daisy Floro, MD  Clinic Note: Chief Complaint  Patient presents with  . Follow-up    After Myoview.  No recurrent symptoms  . Chest Pain    No recurrent pain  . Palpitations    Tachypalpitations    HPI:  Jeremy Glover is a 67 y.o. 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  He also has Well-controlled hypertension with a family history only of hypertension.  No family history of premature coronary disease.  Jeremy Glover went to [facility] ER on February 17 with complaints of chest tightness and shortness of breath.  He really had 2 episodes of symptoms (first episode day before), and it was after the second episode that he went to the ER  He was seen in initial consultation on December 20, 2017.  He noted he  Recent Hospitalizations: See above  Studies Personally Reviewed - (if available, images/films reviewed: From Epic Chart or Care Everywhere)  Myoview January 05, 2018: Exercise 12 minutes.  Reached 101% 13.4 METs.  LVEF 51%.  Normal blood pressure response to exercise.  No EKG changes.  No evidence of ischemia or infarction.  LOW RISK  Interval History: Jeremy Glover presents here today for f/u of Myoview.  He indicates he is not had any further episodes of chest discomfort or rapid heartbeat since I last saw him.  He has been walking around and getting up and about, but has not yet actually done anything relatively vigorous until he returned here.  He has not had any feelings of the fast heart rate spells that came on, but reiterates that he is not done activity to the extent that he had prior to his 2 episodes.  No sensation of rapid irregular heartbeats or palpitations.  No recurrent chest  tightness pressure with rest or exertion.  No PND, orthopnea or edema.  No syncope/near syncope or TIA/amaurosis fugax. No claudication.  ROS: A comprehensive was performed.  Pertinent symptoms noted in HPI Review of Systems  Constitutional: Negative for malaise/fatigue.  HENT: Negative for congestion (But does have postnasal drip).   Respiratory: Negative for cough (Resolved), sputum production, shortness of breath (Per HPI) and wheezing (Resolved).   Gastrointestinal: Negative for blood in stool, constipation and melena.  Genitourinary: Negative for hematuria.  Musculoskeletal: Negative for falls, joint pain and myalgias.  Neurological: Positive for dizziness (HPI). Negative for weakness.  Endo/Heme/Allergies: Positive for environmental allergies.  Psychiatric/Behavioral: Negative for memory loss. The patient is not nervous/anxious and does not have insomnia.   All other systems reviewed and are negative.  I have reviewed and (if needed) personally updated the patient's problem list, medications, allergies, past medical and surgical history, social and family history.   Past Medical History:  Diagnosis Date  . Elevated PSA   . Hypertension     Past Surgical History:  Procedure Laterality Date  . TONSILLECTOMY  age 27    Current Meds  Medication Sig  . Ascorbic Acid (VITAMIN C) 1000 MG tablet Take 1,000 mg by mouth daily.   Marland Kitchen aspirin 81 MG tablet Take 81 mg by mouth daily.  Marland Kitchen BREO ELLIPTA 200-25 MCG/INH AEPB INHALE 1 PUFF INTO THE LUNGS DAILY.  . cetirizine (ZYRTEC) 10 MG tablet Take  10 mg by mouth daily.   . Multiple Vitamin (MULTIVITAMIN WITH MINERALS) TABS tablet Take 1 tablet by mouth daily.   . Omega-3 Fatty Acids (FISH OIL PO) Take 1 tablet by mouth daily.  . valsartan (DIOVAN) 80 MG tablet Take 80 mg by mouth daily.    No Known Allergies  Social History   Tobacco Use  . Smoking status: Never Smoker  . Smokeless tobacco: Never Used  . Tobacco comment: socially in  college  Substance Use Topics  . Alcohol use: Yes    Alcohol/week: 0.0 oz    Comment: 2 glasses of wine/beer w/ dinner  . 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  He he also walks routinely and has no problems going up and down stairs at baseline.   He also has joined the [employer].    family history includes Alzheimer's disease in his mother; Lung cancer in his paternal grandmother; Other in his father.  Wt Readings from Last 3 Encounters:  01/30/18 164 lb 3.2 oz (74.5 kg)  01/05/18 164 lb (74.4 kg)  12/20/17 164 lb 12.8 oz (74.8 kg)    PHYSICAL EXAM BP 121/78   Pulse 75   Ht 5\' 10"  (1.778 m)   Wt 164 lb 3.2 oz (74.5 kg)   SpO2 97%   BMI 23.56 kg/m  Physical Exam  Constitutional: He is oriented to person, place, and time. He appears well-developed and well-nourished. No distress.  Healthy-appearing.  Well-groomed.  HENT:  Head: Normocephalic and atraumatic.  Neck: Normal range of motion. Neck supple. No hepatojugular reflux and no JVD present. Carotid bruit is not present.  Cardiovascular: Normal rate, regular rhythm, normal heart sounds, intact distal pulses and normal pulses.  No extrasystoles are present. PMI is not displaced. Exam reveals no gallop and no friction rub.  No murmur heard. Pulmonary/Chest: Effort normal and breath sounds normal. No respiratory distress. He has no wheezes. He has no rales. He exhibits no tenderness.  Musculoskeletal: Normal range of motion. He exhibits no edema.  Neurological: He is alert and oriented to person, place, and time.  Skin: He is not diaphoretic.  Mild spider/varicose veins in his ankles  Psychiatric: He has a normal mood and affect. His behavior is normal.  Judgment and thought content normal.  Nursing note and vitals reviewed.    Adult ECG Report not checked today.   Other studies Reviewed: Additional studies/ records that were reviewed today include:  Recent Labs:   Lab Results  Component Value Date   WBC 5.6 12/19/2017   HGB 15.5 12/19/2017   HCT 45.2 12/19/2017   MCV 90.9 12/19/2017   PLT 260 12/19/2017   Lab Results  Component Value Date   CREATININE 0.85 12/19/2017   BUN 13 12/19/2017   NA 141 12/19/2017   K 4.4 12/19/2017   CL 103 12/19/2017   CO2 30 12/19/2017   No results found for: TSH No results found for: CHOL, HDL, LDLCALC, LDLDIRECT, TRIG, CHOLHDL  ASSESSMENT / PLAN: Problem List Items Addressed This Visit    Rapid palpitations - Primary (Chronic)    He has not had any further episodes.  It does sound like what he had during  the initial episode may have rare well been an SVT episode.  Unfortunately, he is not had any further episodes to be were not able to evaluate.  We did again reiterate that symptomatic occurring very frequently a monitor will probably not capture one.  However if he has a mobile rhythm monitor such as " Kardia" By [employer]. (which is an application that allows monitoring heart rhythms after purchasing a monitor strip).  This can be used in an as-needed basis to get a short rhythm strip when symptoms are occurring.    As the symptoms are potentially consistent with SVT, I explained SVT and discussed vagal maneuvers.  Also instructed him that he needs to stay adequately hydrated and avoid triggers such as caffeine.  I want him to resume full activities.      Essential hypertension (Chronic)    Relatively well controlled on Diovan.      Chest pain with moderate risk for cardiac etiology (Chronic)    2 episodes of chest pain that were somewhat concerning for exertional angina, but also potentially for tachypalpitations. Treadmill Myoview was negative and he got his heart rate up quite high.   Good exercise tolerance.  Unlikely to be fixed CAD.         Current medicines are reviewed at length with the patient today. (+/- concerns) n/a The following changes have been made: n/a Patient Instructions  Medication Instructions:  No medication changes    Follow-Up: Your physician wants you to follow-up in: 6 months with Dr. Herbie Baltimore. You will receive a reminder letter in the mail two months in advance. If you don't receive a letter, please call our office to schedule the follow-up appointment.   Any Other Special Instructions Will Be Listed Below (If Applicable). Recommendations for vagal maneuvers:  "Bearing down"  Coughing  Gagging  Icy, cold towel on face or drink ice cold water Resume all activities Avoid Caffeine Stay Hydrated- drink at least 8-10 glasses of water a day (urine should be clear)   DR Avy Barlett RECOMMENDS THAT YOU  PURCHASES  " Kardia" By [employer]. FROM THE  [employer]  APP PLAY STORE.   THE APP IS FREE , BUT THE  EQUIPMENT HAS A COST. IT IS A WAY FOR YOU TO OBTAIN A RECORDING OF YOUR HEART RATE . IT WILL BE A SHORT RHYTHM  STRIP THAT YOU CAN SHOW TO MEDICAL STAFF.   If you need a refill on your cardiac medications before your next appointment, please call your pharmacy.    Studies Ordered:   No orders of the defined types were placed in this encounter.     Bryan Lemma, M.D., M.S. Interventional Cardiologist   Pager # [phone] Phone # [phone] [address] [place], [place] [address]   Thank you for choosing Heartcare at [facility]!!

## 2018-02-01 ENCOUNTER — Encounter: Payer: Self-pay | Admitting: Cardiology

## 2018-02-01 NOTE — Assessment & Plan Note (Signed)
2 episodes of chest pain that were somewhat concerning for exertional angina, but also potentially for tachypalpitations. Treadmill Myoview was negative and he got his heart rate up quite high.  Good exercise tolerance.  Unlikely to be fixed CAD.

## 2018-02-01 NOTE — Assessment & Plan Note (Addendum)
He has not had any further episodes.  It does sound like what he had during the initial episode may have rare well been an SVT episode.  Unfortunately, he is not had any further episodes to be were not able to evaluate.  We did again reiterate that symptomatic occurring very frequently a monitor will probably not capture one.  However if he has a mobile rhythm monitor such as " Kardia" [employer]. (which is an application that allows monitoring heart rhythms after purchasing a monitor strip).  This can be used in an as-needed basis to get a short rhythm strip when symptoms are occurring.    As the symptoms are potentially consistent with SVT, I explained SVT and discussed vagal maneuvers.  Also instructed him that he needs to stay adequately hydrated and avoid triggers such as caffeine.  I want him to resume full activities.

## 2018-02-01 NOTE — Assessment & Plan Note (Signed)
Relatively well controlled on Diovan.

## 2018-03-06 ENCOUNTER — Telehealth: Payer: Self-pay | Admitting: Pulmonary Disease

## 2018-03-06 MED ORDER — PREDNISONE 10 MG PO TABS: ORAL_TABLET | ORAL | 0 refills | Status: DC

## 2018-03-06 NOTE — Telephone Encounter (Signed)
Call in prednisone taper starting at 40 mg. Reduce dose by 10 mg every 3 days.  

## 2018-03-06 NOTE — Telephone Encounter (Signed)
Called and spoke to pt.  Pt c/o prod cough with yellow thick mucus, wheezing & chest congestion x 3 days.  Denies fever, chills, sweats or body aches.  Pt is requesting recommendations.  Pt has pending apt for 03/13/18.  Dr. Isaiah Serge please advise. Thanks

## 2018-03-06 NOTE — Telephone Encounter (Signed)
Pt is aware of below message and voiced his understanding. Rx for Prednisone has been sent to preferred pharmacy. Nothing further is needed.  

## 2018-03-13 ENCOUNTER — Encounter: Payer: Self-pay | Admitting: Pulmonary Disease

## 2018-03-13 ENCOUNTER — Ambulatory Visit: Payer: Medicare Other | Admitting: Pulmonary Disease

## 2018-03-13 VITALS — BP 128/80 | HR 73 | Ht 70.0 in | Wt 162.4 lb

## 2018-03-13 DIAGNOSIS — R058 Other specified cough: Secondary | ICD-10-CM

## 2018-03-13 DIAGNOSIS — J4530 Mild persistent asthma, uncomplicated: Secondary | ICD-10-CM

## 2018-03-13 DIAGNOSIS — R05 Cough: Secondary | ICD-10-CM

## 2018-03-13 DIAGNOSIS — R059 Cough, unspecified: Secondary | ICD-10-CM

## 2018-03-13 LAB — NITRIC OXIDE: Nitric Oxide: 24

## 2018-03-13 NOTE — Progress Notes (Signed)
Jeremy Glover    [id number]    12-May-1951  Primary Care Physician:Ross, Darlen Round, MD  Referring Physician: Daisy Floro, MD [address] 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 He has chronic cough for the past few years. This is nonproductive in nature, not associated with dyspnea, wheezing. He has daily symptoms. He wakes up in the morning coughing. He has issues of nasal discharge, postnasal drip and. He is being followed by Dr. Suszanne Conners, ENT who has started him on nasal spray and gabapentin.  His had an EGD which showed a few gastric polyps, GERD without esophagitis. Is now on Protonix. He was started on Qvar at the last visit. He does not notice any difference in symptoms however he reports intermittent atypical chest pain on using the Qvar. He was evaluated by Dr. 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 He had a CT of the neck done with no abnormality  Interim History: He called our office last week with worsening dyspnea, cough, congestion.  He was given a prednisone taper with improvement in symptoms.  He still has occasional wheeze, dyspnea, sputum production otherwise overall he is improving.  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  Outpatient Encounter Medications as of 03/13/2018  Medication Sig  . Ascorbic Acid (VITAMIN C) 1000 MG tablet Take 1,000 mg by mouth daily.   Marland Kitchen aspirin 81 MG tablet Take 81 mg by mouth daily.  Marland Kitchen BREO ELLIPTA 200-25 MCG/INH AEPB INHALE 1 PUFF INTO THE LUNGS DAILY.  . cetirizine (ZYRTEC) 10 MG tablet Take 10 mg by mouth daily.   . Multiple Vitamin (MULTIVITAMIN WITH MINERALS) TABS tablet Take 1 tablet by mouth daily.   . Omega-3 Fatty Acids (FISH OIL PO)  Take 1 tablet by mouth daily.  . predniSONE (DELTASONE) 10 MG tablet 4 tabs x 3 days, 3 tabs x 3 days, 2 tabs x 3 days, 1 tab x 3 days then stop  . valsartan (DIOVAN) 80 MG tablet Take 80 mg by mouth daily.   No facility-administered encounter medications on file as of 03/13/2018.     Allergies as of 03/13/2018  . (No Known Allergies)    Past Medical History:  Diagnosis Date  . Elevated PSA   . Hypertension     Past Surgical History:  Procedure Laterality Date  . TONSILLECTOMY  age 21    Family History  Problem Relation Age of Onset  . Lung cancer Paternal Grandmother        smoker  . Other Father        Pulmonary Fibrosis, smoker  . Alzheimer's disease Mother     Social History   Socioeconomic History  . Marital status: Married    Spouse name: Not on file  . Number of children: Not on file  . Years of education: Not on file  . Highest education level: Not on file  Occupational History  . Not on file  Social Needs  . Financial resource strain: Not on file  . Food insecurity:    Worry: Not on file    Inability: Not on file  . Transportation needs:    Medical: Not on file    Non-medical:  Not on file  Tobacco Use  . Smoking status: Never Smoker  . Smokeless tobacco: Never Used  . Tobacco comment: socially in college  Substance and Sexual Activity  . Alcohol use: Yes    Alcohol/week: 0.0 oz    Comment: 2 glasses of wine/beer w/ dinner  . Drug use: No  . Sexual activity: Not on file  Lifestyle  . Physical activity:    Days per week: Not on file    Minutes per session: Not on file  . Stress: Not on file  Relationships  . Social connections:    Talks on phone: Not on file    Gets together: Not on file    Attends religious service: Not on file    Active member of club or organization: Not on file    Attends meetings of clubs or organizations: Not on file    Relationship status: Not on file  . Intimate partner violence:    Fear of current or ex partner:  Not on file    Emotionally abused: Not on file    Physically abused: Not on file    Forced sexual activity: Not on file  Other Topics Concern  . Not on file  Social History Narrative   Married, lives with spouse   2 grown Energy manager   Recent travel to [place] early March (they own a house there)   He is very active in the warmer weather he spends time exercising on his bicycle.  He he also walks routinely and has no problems going up and down stairs at baseline.   He also has joined the [employer].   Review of systems: Review of Systems  Constitutional: Negative for fever and chills.  HENT: Negative.   Eyes: Negative for blurred vision.  Respiratory: as per HPI  Cardiovascular: Negative for chest pain and palpitations.  Gastrointestinal: Negative for vomiting, diarrhea, blood per rectum. Genitourinary: Negative for dysuria, urgency, frequency and hematuria.  Musculoskeletal: Negative for myalgias, back pain and joint pain.  Skin: Negative for itching and rash.  Neurological: Negative for dizziness, tremors, focal weakness, seizures and loss of consciousness.  Endo/Heme/Allergies: Negative for environmental allergies.  Psychiatric/Behavioral: Negative for depression, suicidal ideas and hallucinations.  All other systems reviewed and are negative.  Physical Exam: Blood pressure 128/80, pulse 73, height  (1.778 m), weight 162 lb 6.4 oz (73.7 kg), SpO2 97 %. Gen:      No acute distress HEENT:  EOMI, sclera anicteric Neck:     No masses; no thyromegaly Lungs:    rare expiratory wheeze.  No crackles CV:         Regular rate and rhythm; no murmurs Abd:      + bowel sounds; soft, non-tender; no palpable masses, no distension Ext:    No edema; adequate peripheral perfusion Skin:      Warm and dry; no rash Neuro: alert and oriented x 3 Psych: normal mood and affect  Data Reviewed: PFT's 04/30/2016 FVC: 3.89 ( 83%), FEV1 2.81  (  80%), FEF 25-75 percent 1.85 (66%], DB response +25%, F/F 73, TLC 5.99 (85%), DLCO 69% Minimal diffusion defect, small airways disease Mild diffusion defect but corrects for alveolar volume  FENO  01/21/17- 49 09/13/17- 28 03/13/2018-24  Chest x-ray 01/25/2016-  No acute cardiopulmonary abnormality.  Chest x-ray 12/19/2017-no acute cardiopulmonary abnormality. I have reviewed the images personally  EGD 07/13/16 Few gastric polyps, biopsy. Normal duodenum, GERD without esophagitis  CBC 01/21/17-WBC 5.9, eosinophils 5.5%, absolute eosinophil count 325 Blood allergy profile IgE 109, sensitive to ragweed, elm  Assessment:  Mild persistent asthma with exacerbation Currently on a prednisone taper for exacerbation.  He is making slow improvement.  FENO is low today. He will finish the prednisone taper.  Continue Breo inhaler.  Cough From upper airway cough syndrome, postnasal drip, GERD, vocal cord paralysis.  Continues on Protonix, Zyrtec, Flonase.  Follows up with ENT  Plan/Recommendations: - Continue Breo, albuterol inhaler - Finish prednisone taper for asthma exacerbation.  Chilton Greathouse MD [facility] 03/13/2018, 4:41 PM  CC: Daisy Floro, MD

## 2018-03-13 NOTE — Patient Instructions (Signed)
Your test shows improving inflammation in the lung Continue the prednisone taper and the Breo inhaler Follow-up in 6 months.

## 2018-03-17 ENCOUNTER — Encounter (HOSPITAL_COMMUNITY): Payer: Self-pay

## 2018-03-17 ENCOUNTER — Emergency Department (HOSPITAL_COMMUNITY)
Admission: EM | Admit: 2018-03-17 | Discharge: 2018-03-17 | Disposition: A | Discharge status: Home / Self Care | Payer: Medicare Other | Attending: Emergency Medicine | Admitting: Emergency Medicine

## 2018-03-17 ENCOUNTER — Emergency Department (HOSPITAL_COMMUNITY): Payer: Medicare Other

## 2018-03-17 DIAGNOSIS — R079 Chest pain, unspecified: Secondary | ICD-10-CM | POA: Insufficient documentation

## 2018-03-17 DIAGNOSIS — Z79899 Other long term (current) drug therapy: Secondary | ICD-10-CM | POA: Insufficient documentation

## 2018-03-17 DIAGNOSIS — R252 Cramp and spasm: Secondary | ICD-10-CM | POA: Diagnosis present

## 2018-03-17 DIAGNOSIS — R000 Tachycardia, unspecified: Secondary | ICD-10-CM | POA: Insufficient documentation

## 2018-03-17 LAB — I-STAT TROPONIN, ED
Troponin i, poc: 0 ng/mL (ref 0.00–0.08)
Troponin i, poc: 0 ng/mL (ref 0.00–0.08)

## 2018-03-17 LAB — BASIC METABOLIC PANEL
ANION GAP: 9 (ref 5–15)
BUN: 12 mg/dL (ref 6–20)
CALCIUM: 10.2 mg/dL (ref 8.9–10.3)
CO2: 30 mmol/L (ref 22–32)
CREATININE: 1.04 mg/dL (ref 0.61–1.24)
Chloride: 102 mmol/L (ref 101–111)
GFR calc non Af Amer: >60 mL/min (ref >60)
Glucose, Bld: 115 mg/dL — ABNORMAL HIGH (ref 65–99)
Potassium: 3.9 mmol/L (ref 3.5–5.1)
SODIUM: 141 mmol/L (ref 135–145)

## 2018-03-17 LAB — CBC
HCT: 48.2 % (ref 39.0–52.0)
HEMOGLOBIN: 15.7 g/dL (ref 13.0–17.0)
MCH: 29.4 pg (ref 26.0–34.0)
MCHC: 32.6 g/dL (ref 30.0–36.0)
MCV: 90.3 fL (ref 78.0–100.0)
Platelets: 378 10*3/uL (ref 150–400)
RBC: 5.34 MIL/uL (ref 4.22–5.81)
RDW: 13.6 % (ref 11.5–15.5)
WBC: 11.3 10*3/uL — AB (ref 4.0–10.5)

## 2018-03-17 NOTE — Discharge Instructions (Addendum)
Call Dr. Tenny Craw on Monday, 03/20/2018 to schedule follow-up appointment in the office.  Return if your condition worsens or if concern for any reason

## 2018-03-17 NOTE — ED Triage Notes (Signed)
Pt states while at work he began having cramps in hand and checked hr and it was 130 and irregular. Pt reports palpitations and SOB. Denies CP.

## 2018-03-17 NOTE — ED Provider Notes (Signed)
[facility] EMERGENCY DEPARTMENT Provider Note   CSN: [id number] Arrival date & time: 03/17/18  1539     History   Chief Complaint No chief complaint on file.   HPI Jeremy Glover is a 67 y.o. male.  Sitting at desk writing while at work 2 PM today when he developed cramping in left hand.  He took his pulse ox which was 96%.  Pulse ox showed a fast heartbeat but he does not know the result.  He denies any chest pain lightheadedness or shortness of breath.  He does state that he felt "weird" he is presently asymptomatic.  No treatment prior to coming here.  No other associated symptoms.  He does admit to emotional stress since yesterday, as he bought a new car which was stressful for him nothing makes symptoms better or worse.  Symptoms resolved spontaneously without treatment.  Patient is left-handed  HPI  Past Medical History:  Diagnosis Date  . Elevated PSA   . Hypertension     Patient Active Problem List   Diagnosis Date Noted  . Chest pain with moderate risk for cardiac etiology 12/20/2017  . Rapid palpitations 12/20/2017  . Essential hypertension 12/20/2017  . Mild persistent asthma 01/21/2017  . Upper airway cough syndrome 04/30/2016    Past Surgical History:  Procedure Laterality Date  . TONSILLECTOMY  age 71        Home Medications    Prior to Admission medications   Medication Sig Start Date End Date Taking? Authorizing Provider  Ascorbic Acid (VITAMIN C) 1000 MG tablet Take 1,000 mg by mouth daily.     [provider]  aspirin 81 MG tablet Take 81 mg by mouth daily.    [provider]  BREO ELLIPTA 200-25 MCG/INH AEPB INHALE 1 PUFF INTO THE LUNGS DAILY. 01/30/18   Mannam, Colbert Coyer, MD  cetirizine (ZYRTEC) 10 MG tablet Take 10 mg by mouth daily.     [provider]  Multiple Vitamin (MULTIVITAMIN WITH MINERALS) TABS tablet Take 1 tablet by mouth daily.     [provider]  Omega-3 Fatty Acids (FISH OIL PO)  Take 1 tablet by mouth daily.    [provider]  predniSONE (DELTASONE) 10 MG tablet 4 tabs x 3 days, 3 tabs x 3 days, 2 tabs x 3 days, 1 tab x 3 days then stop 03/06/18   Mannam, Praveen, MD  valsartan (DIOVAN) 80 MG tablet Take 80 mg by mouth daily.    [provider]    Family History Family History  Problem Relation Age of Onset  . Lung cancer Paternal Grandmother        smoker  . Other Father        Pulmonary Fibrosis, smoker  . Alzheimer's disease Mother   No family history of coronary disease Social History Social History   Tobacco Use  . Smoking status: Never Smoker  . Smokeless tobacco: Never Used  . Tobacco comment: socially in college  Substance Use Topics  . Alcohol use: Yes    Alcohol/week: 0.0 oz    Comment: 2 glasses of wine/beer w/ dinner  . Drug use: No   1-2 alcoholic beverages per night Allergies   Patient has no known allergies.   Review of Systems Review of Systems  Constitutional: Negative.   HENT: Negative.   Respiratory: Negative.   Cardiovascular: Positive for chest pain.       Syncope  Gastrointestinal: Negative.   Musculoskeletal: Positive for myalgias.  Muscle cramps left hand  Skin: Negative.   Allergic/Immunologic: Negative.   Neurological: Negative.   Psychiatric/Behavioral: Negative.   All other systems reviewed and are negative.    Physical Exam Updated Vital Signs BP 133/79   Pulse 82   Temp 98.2 F (36.8 C) (Oral)   Resp 18   Ht  (1.753 m)   Wt 73.5 kg (162 lb)   SpO2 97%   BMI 23.92 kg/m   Physical Exam  Constitutional: He appears well-developed and well-nourished.  HENT:  Head: Normocephalic and atraumatic.  Eyes: Pupils are equal, round, and reactive to light. Conjunctivae are normal.  Neck: Neck supple. No tracheal deviation present. No thyromegaly present.  Cardiovascular: Normal rate and regular rhythm.  No murmur heard. Pulmonary/Chest: Effort normal and breath sounds normal.    Abdominal: Soft. Bowel sounds are normal. He exhibits no distension. There is no tenderness.  Musculoskeletal: Normal range of motion. He exhibits no edema or tenderness.  Neurological: He is alert. Coordination normal.  Skin: Skin is warm and dry. No rash noted.  Psychiatric: He has a normal mood and affect.  Nursing note and vitals reviewed.    ED Treatments / Results  Labs (all labs ordered are listed, but only abnormal results are displayed) Labs Reviewed  BASIC METABOLIC PANEL - Abnormal; Notable for the following components:      Result Value   Glucose, Bld 115 (*)    All other components within normal limits  CBC - Abnormal; Notable for the following components:   WBC 11.3 (*)    All other components within normal limits  I-STAT TROPONIN, ED    EKG EKG Interpretation  Date/Time:  Friday Mar 17 2018 15:47:42 EDT Ventricular Rate:  123 PR Interval:  136 QRS Duration: 82 QT Interval:  308 QTC Calculation: 440 R Axis:   22 Text Interpretation:  Sinus tachycardia Nonspecific ST abnormality Abnormal ECG No significant change since last tracing Confirmed by Doug Sou [phone]) on 03/17/2018 5:24:03 PM  Results for orders placed or performed during the hospital encounter of 03/17/18  Basic metabolic panel  Result Value Ref Range   Sodium 141 135 - 145 mmol/L   Potassium 3.9 3.5 - 5.1 mmol/L   Chloride 102 101 - 111 mmol/L   CO2 30 22 - 32 mmol/L   Glucose, Bld 115 (H) 65 - 99 mg/dL   BUN 12 6 - 20 mg/dL   Creatinine, Ser 1.91 0.61 - 1.24 mg/dL   Calcium 47.8 8.9 - 29.5 mg/dL   GFR calc non Af Amer >60 >60 mL/min   GFR calc Af Amer >60 >60 mL/min   Anion gap 9 5 - 15  CBC  Result Value Ref Range   WBC 11.3 (H) 4.0 - 10.5 K/uL   RBC 5.34 4.22 - 5.81 MIL/uL   Hemoglobin 15.7 13.0 - 17.0 g/dL   HCT 62.1 30.8 - 65.7 %   MCV 90.3 78.0 - 100.0 fL   MCH 29.4 26.0 - 34.0 pg   MCHC 32.6 30.0 - 36.0 g/dL   RDW 84.6 96.2 - 95.2 %   Platelets 378 150 - 400 K/uL   I-stat troponin, ED  Result Value Ref Range   Troponin i, poc 0.00 0.00 - 0.08 ng/mL   Comment 3          I-stat troponin, ED  Result Value Ref Range   Troponin i, poc 0.00 0.00 - 0.08 ng/mL   Comment 3  Dg Chest 2 View  Result Date: 03/17/2018 CLINICAL DATA:  67 year old male with dyspnea. EXAM: CHEST - 2 VIEW COMPARISON:  12/19/2017 FINDINGS: The heart size and mediastinal contours are within normal limits. Stable pulmonary hyperinflation. The visualized skeletal structures are unremarkable. IMPRESSION: No active cardiopulmonary disease.  Pulmonary hyperinflation. Electronically Signed   By: Tollie Eth M.D.   On: 03/17/2018 17:48   Radiology Dg Chest 2 View  Result Date: 03/17/2018 CLINICAL DATA:  67 year old male with dyspnea. EXAM: CHEST - 2 VIEW COMPARISON:  12/19/2017 FINDINGS: The heart size and mediastinal contours are within normal limits. Stable pulmonary hyperinflation. The visualized skeletal structures are unremarkable. IMPRESSION: No active cardiopulmonary disease.  Pulmonary hyperinflation. Electronically Signed   By: Tollie Eth M.D.   On: 03/17/2018 17:48    Procedures Procedures (including critical care time)  Medications Ordered in ED Medications - No data to display  chest reviewed by me Initial Impression / Assessment and Plan / ED Course  I have reviewed the triage vital signs and the nursing notes.  Pertinent labs & imaging results that were available during my care of the patient were reviewed by me and considered in my medical decision making (see chart for details).     Patient had stress myocardial perfusion study January 05, 2018 showing normal myocardial perfusion.  EF 51%  8:50 PM patient remains asymptomatic.  Rapid heartbeat transient likely secondary to anxiety which patient freely admits.  Doubt acute coronary syndrome.  Plan follow-up with Dr. Tenny Craw  Final Clinical Impressions(s) / ED Diagnoses  Diagnosis #1 tachycardia #2 muscle  cramps Final diagnoses:  None    ED Discharge Orders    None       Doug Sou, MD 03/17/18 2054

## 2018-08-08 ENCOUNTER — Other Ambulatory Visit: Payer: Self-pay | Admitting: Pulmonary Disease

## 2019-01-05 ENCOUNTER — Emergency Department (HOSPITAL_COMMUNITY): Payer: Medicare Other

## 2019-01-05 ENCOUNTER — Other Ambulatory Visit: Payer: Self-pay

## 2019-01-05 ENCOUNTER — Emergency Department (HOSPITAL_COMMUNITY)
Admission: EM | Admit: 2019-01-05 | Discharge: 2019-01-05 | Disposition: A | Discharge status: Home / Self Care | Payer: Medicare Other | Attending: Emergency Medicine | Admitting: Emergency Medicine

## 2019-01-05 DIAGNOSIS — J320 Chronic maxillary sinusitis: Secondary | ICD-10-CM | POA: Insufficient documentation

## 2019-01-05 DIAGNOSIS — J4530 Mild persistent asthma, uncomplicated: Secondary | ICD-10-CM | POA: Diagnosis not present

## 2019-01-05 DIAGNOSIS — Z79899 Other long term (current) drug therapy: Secondary | ICD-10-CM | POA: Insufficient documentation

## 2019-01-05 DIAGNOSIS — R05 Cough: Secondary | ICD-10-CM | POA: Diagnosis present

## 2019-01-05 DIAGNOSIS — I10 Essential (primary) hypertension: Secondary | ICD-10-CM | POA: Insufficient documentation

## 2019-01-05 DIAGNOSIS — Z7982 Long term (current) use of aspirin: Secondary | ICD-10-CM | POA: Diagnosis not present

## 2019-01-05 LAB — INFLUENZA PANEL BY PCR (TYPE A & B)
Influenza A By PCR: NEGATIVE
Influenza B By PCR: NEGATIVE

## 2019-01-05 MED ORDER — AMOXICILLIN-POT CLAVULANATE 875-125 MG PO TABS: 1.0000 | ORAL_TABLET | Freq: Two times a day (BID) | ORAL | 0 refills | Status: DC

## 2019-01-05 NOTE — ED Provider Notes (Signed)
Medical screening examination/treatment/procedure(s) were conducted as a shared visit with non-physician practitioner(s) and myself.  I personally evaluated the patient during the encounter.  None 68 year old male presents with 1 day history of hemoptysis characterizes blood mixed with his saliva.  Does endorse sinus-like issues.  Chest x-ray is negative here.  Does not take blood thinners.  His pulse oximetry is stable.  Return precautions given   Lorre Nick, MD 01/05/19 [phone]

## 2019-01-05 NOTE — ED Provider Notes (Signed)
[facility] EMERGENCY DEPARTMENT Provider Note   CSN: [id number] Arrival date & time: 01/05/19  1191    History   Chief Complaint Chief Complaint  Patient presents with  . Cough    blood in sputum    HPI Jeremy Glover is a 68 y.o. male.     The history is provided by the patient and medical records. No language interpreter was used.  Cough     68 year old with hx of HTN, asthma here with cough.  Patient states he went to visit his children and grandchildren approximately 2 weeks ago.  3 days after visiting them he developed sinus congestion and productive cough.  Cough is productive with green sputum.  Cough has increased in frequency, he endorsed throat irritation and now for the past few days he also noticed streaks of blood in his sputum.  He did not complain of any fever, chills, headache, runny nose, ear pain, shortness of breath, abdominal pain.  He was seen by his PCP 2 days ago for his symptom and was prescribed a cough syrup and nasal spray.  He felt the symptoms not improving and now with seen blood in his sputum he presented to the ED today for further evaluation.  He denies any recent travel, denies any prior history of PE or DVT, no history of malignancy, he is not a smoker.  He mention 1 of his grandchildren was diagnosed with the flu and another one was diagnosed with strep infection recently.    Past Medical History:  Diagnosis Date  . Elevated PSA   . Hypertension     Patient Active Problem List   Diagnosis Date Noted  . Chest pain with moderate risk for cardiac etiology 12/20/2017  . Rapid palpitations 12/20/2017  . Essential hypertension 12/20/2017  . Mild persistent asthma 01/21/2017  . Upper airway cough syndrome 04/30/2016    Past Surgical History:  Procedure Laterality Date  . TONSILLECTOMY  age 73        Home Medications    Prior to Admission medications   Medication Sig Start Date End Date Taking? Authorizing Provider    Ascorbic Acid (VITAMIN C) 1000 MG tablet Take 1,000 mg by mouth daily.     [provider]  aspirin 81 MG tablet Take 81 mg by mouth daily.    [provider]  BREO ELLIPTA 200-25 MCG/INH AEPB INHALE 1 PUFF INTO THE LUNGS DAILY. 08/08/18   Mannam, Colbert Coyer, MD  cetirizine (ZYRTEC) 10 MG tablet Take 10 mg by mouth daily.     [provider]  Multiple Vitamin (MULTIVITAMIN WITH MINERALS) TABS tablet Take 1 tablet by mouth daily.     [provider]  Omega-3 Fatty Acids (FISH OIL PO) Take 1 tablet by mouth daily.    [provider]  predniSONE (DELTASONE) 10 MG tablet 4 tabs x 3 days, 3 tabs x 3 days, 2 tabs x 3 days, 1 tab x 3 days then stop 03/06/18   Mannam, Praveen, MD  valsartan (DIOVAN) 80 MG tablet Take 80 mg by mouth daily.    [provider]    Family History Family History  Problem Relation Age of Onset  . Lung cancer Paternal Grandmother        smoker  . Other Father        Pulmonary Fibrosis, smoker  . Alzheimer's disease Mother     Social History Social History   Tobacco Use  . Smoking status: Never Smoker  .  Smokeless tobacco: Never Used  . Tobacco comment: socially in college  Substance Use Topics  . Alcohol use: Yes    Alcohol/week: 0.0 standard drinks    Comment: 2 glasses of wine/beer w/ dinner  . Drug use: No     Allergies   Patient has no known allergies.   Review of Systems Review of Systems  Respiratory: Positive for cough.   All other systems reviewed and are negative.    Physical Exam Updated Vital Signs BP 135/88 (BP Location: Right Arm)   Pulse 86   Temp 98.2 F (36.8 C) (Oral)   Resp 16   Ht 5\' 10"  (1.778 m)   Wt 74.8 kg   SpO2 97%   BMI 23.68 kg/m   Physical Exam Vitals signs and nursing note reviewed.  Constitutional:      General: He is not in acute distress.    Appearance: He is well-developed.  HENT:     Head: Atraumatic.     Comments: Mild tenderness to percussion of  maxillary sinuses    Right Ear: Tympanic membrane normal.     Left Ear: Tympanic membrane normal.     Nose: Nose normal. No rhinorrhea.     Mouth/Throat:     Mouth: Mucous membranes are moist.     Comments: Uvula midline, no tonsillar enlargement or exudates Eyes:     Conjunctiva/sclera: Conjunctivae normal.  Neck:     Musculoskeletal: Normal range of motion and neck supple. No neck rigidity.  Cardiovascular:     Rate and Rhythm: Normal rate and regular rhythm.     Pulses: Normal pulses.     Heart sounds: Normal heart sounds.  Pulmonary:     Effort: Pulmonary effort is normal.     Breath sounds: Normal breath sounds. No wheezing, rhonchi or rales.  Abdominal:     Palpations: Abdomen is soft.     Tenderness: There is no abdominal tenderness.  Musculoskeletal:        General: No swelling.  Lymphadenopathy:     Cervical: No cervical adenopathy.  Skin:    Findings: No rash.  Neurological:     Mental Status: He is alert and oriented to person, place, and time.      ED Treatments / Results  Labs (all labs ordered are listed, but only abnormal results are displayed) Labs Reviewed  INFLUENZA PANEL BY PCR (TYPE A & B)    EKG None  Radiology Dg Chest 2 View  Result Date: 01/05/2019 CLINICAL DATA:  Cough.  Hemoptysis. EXAM: CHEST - 2 VIEW COMPARISON:  03/17/2018 FINDINGS: The heart size and mediastinal contours are within normal limits. Both lungs are clear. The visualized skeletal structures are unremarkable. IMPRESSION: Normal exam. Electronically Signed   By: Francene Boyers M.D.   On: 01/05/2019 07:42    Procedures Procedures (including critical care time)  Medications Ordered in ED Medications - No data to display   Initial Impression / Assessment and Plan / ED Course  I have reviewed the triage vital signs and the nursing notes.  Pertinent labs & imaging results that were available during my care of the patient were reviewed by me and considered in my medical  decision making (see chart for details).        BP 132/80   Pulse 72   Temp 98.2 F (36.8 C) (Oral)   Resp 20   Ht 5\' 10"  (1.778 m)   Wt 74.8 kg   SpO2 99%   BMI 23.68 kg/m  Final Clinical Impressions(s) / ED Diagnoses   Final diagnoses:  Maxillary sinusitis, unspecified chronicity    ED Discharge Orders         Ordered    amoxicillin-clavulanate (AUGMENTIN) 875-125 MG tablet  2 times daily     01/05/19 0852         7:41 AM Patient here with productive cough with trace of blood in his sputum.  Blood is likely from tracheal irritation from persistent cough.  He has some mild hoarseness as well.  He also has nasal congestion.  I suspect this is likely to be in bronchitis.  Low suspicion for PE, or malignancy.  Due to recent flu contact, will obtain flu swab and chest x-ray.  Vital signs stable.  No hypoxia.  8:45 AM Chest x-ray unremarkable, flu test negative.  Patient is well-appearing, ambulate without any hypoxia.  At this time, reassurance given.  Since patient does have postnasal drainage with greenish sputum and his symptom has been ongoing for more than 10 days, there is concern for potential sinusitis causing his complaint.  After discussion, I agreed to prescribe patient Augmentin but made him aware of the potential antibiotic associated diarrhea and encourage patient to take probiotic.  He will follow-up with PCP for further care.   Fayrene Helper, PA-C 01/05/19 [id number]    Lorre Nick, MD 01/08/19 1310

## 2019-01-05 NOTE — ED Triage Notes (Signed)
Per pt he has been sick with a cough and a cold for about 1 week and went to see his primary and was given cough meds. Since then pt stated he has not gotten any better and feels like it has gotten worse. Pt saying he is coughing up blood. Sputum is green. No chest pain or SOB. No fevers.

## 2019-01-05 NOTE — Discharge Instructions (Signed)
Given the worsening of your symptoms including postnasal drips and trace of blood in your sputum, please take Augmentin as treatment for sinusitis.  Take probiotic to decrease risk of antibiotic associated diarrhea.

## 2019-01-05 NOTE — ED Notes (Signed)
Ambulated pt while checking his SpO2. Pt maintained at 100% while walking with no SOB or dizziness. Pt stated that he had some discomfort in his throat. RN notified.

## 2019-02-08 ENCOUNTER — Other Ambulatory Visit: Payer: Self-pay | Admitting: Pulmonary Disease

## 2019-03-12 ENCOUNTER — Other Ambulatory Visit: Payer: Self-pay | Admitting: Pulmonary Disease

## 2019-03-19 ENCOUNTER — Other Ambulatory Visit: Payer: Self-pay | Admitting: Pulmonary Disease

## 2019-03-21 ENCOUNTER — Encounter: Payer: Self-pay | Admitting: Adult Health

## 2019-03-21 ENCOUNTER — Other Ambulatory Visit: Payer: Self-pay

## 2019-03-21 ENCOUNTER — Ambulatory Visit: Payer: Medicare Other | Admitting: Adult Health

## 2019-03-21 DIAGNOSIS — J4530 Mild persistent asthma, uncomplicated: Secondary | ICD-10-CM

## 2019-03-21 MED ORDER — FLUTICASONE FUROATE-VILANTEROL 200-25 MCG/INH IN AEPB: 1.0000 | INHALATION_SPRAY | Freq: Every day | RESPIRATORY_TRACT | 11 refills | Status: AC

## 2019-03-21 MED ORDER — ALBUTEROL SULFATE HFA 108 (90 BASE) MCG/ACT IN AERS: 2.0000 | INHALATION_SPRAY | RESPIRATORY_TRACT | 3 refills | Status: AC | PRN

## 2019-03-21 NOTE — Patient Instructions (Addendum)
May try off BREO.  If breathing worsens off with wheezing, increased shortness of breath , will need to restart  Albuterol inhaler 2 puffs every 4hrs as needed.  Follow up with Pulmonary As needed   Good luck with your move.

## 2019-03-21 NOTE — Assessment & Plan Note (Signed)
Currently well controlled on Breo.  Patient is requesting a trial off of Breo which should be acceptable.  Have advised that if while off of Breo asthma flares with increased wheezing shortness of breath or increased albuterol use he will need to restart Breo.  He can call our office for further instructions.  He may use albuterol as needed for increased wheezing or shortness of breath.  Asthma action plan was discussed in detail.  Patient is moving back to [place].  Have advised him to establish with primary care provider and pulmonary if he needs assistance with his and transfer records to get in touch with our office

## 2019-03-21 NOTE — Addendum Note (Signed)
Addended by: Karlton Lemon on: 03/21/2019 11:32 AM   Modules accepted: Orders

## 2019-03-21 NOTE — Progress Notes (Signed)
@Patient  ID: Jeremy Glover, male    DOB: 1951-03-31, 68 y.o.   MRN: [id number]  Chief Complaint  Patient presents with  . 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   TEST/EVENTS :  PFT June 2017 FEV1 85%, ratio 76, FVC 84%, no significant bronchodilator response, mid flow obstruction and reversibility, DLCO 69% March 2020 chest x-ray showed clear lungs  03/21/2019 Follow up : Asthma  Patient presents for a one-year follow-up.  Patient has mild persistent asthma.  He says since last visit he is doing well.  He denies any flare of cough or wheezing.  He is on Brio daily.  He does request trying off of Breo.  He says he wants to see if his breathing does okay off of this inhaler.  He has been on it for around 2 years.  He says he is very active still works full-time prior to COVID-19 was going to [employer]  He is active outside.  He denies any wheezing or cough.  No albuterol use. Patient says he is retiring next week.  And he is moving back to [place] to be closer to his family.  Patient has chronic intermittent hoarseness.  He has been seen by ENT in the past and diagnosed with bilateral vocal fold atrophy, hypomobility and glottic insufficiency.    No Known Allergies  Immunization History  Administered Date(s) Administered  . Influenza Split 09/02/2015, 07/19/2016  . Influenza, High Dose Seasonal PF 07/11/2017, 07/16/2018    Past Medical History:  Diagnosis Date  . Elevated PSA   . Hypertension     Tobacco History: Social History   Tobacco Use  Smoking Status Never Smoker  Smokeless Tobacco Never Used  Tobacco Comment   socially in college   Counseling given: Not Answered Comment:  socially in college   Outpatient Medications Prior to Visit  Medication Sig Dispense Refill  . aspirin 81 MG tablet Take 81 mg by mouth daily.    Marland Kitchen BREO ELLIPTA 200-25 MCG/INH AEPB INHALE 1 PUFF INTO THE LUNGS DAILY. 60 each 0  . cetirizine (ZYRTEC) 10 MG tablet Take 10 mg by mouth daily.     . Multiple Vitamin (MULTIVITAMIN WITH MINERALS) TABS tablet Take 1 tablet by mouth daily.     . valsartan (DIOVAN) 80 MG tablet Take 80 mg by mouth daily.    . Ascorbic Acid (VITAMIN C) 1000 MG tablet Take 1,000 mg by mouth daily.     . Omega-3 Fatty Acids (FISH OIL PO) Take 1 tablet by mouth daily.    Marland Kitchen amoxicillin-clavulanate (AUGMENTIN) 875-125 MG tablet Take 1 tablet by mouth 2 (two) times daily. One po bid x 7 days 14 tablet 0  . predniSONE (DELTASONE) 10 MG tablet 4 tabs x 3 days, 3 tabs x 3 days, 2 tabs x 3 days, 1 tab x 3 days then stop 30 tablet 0   No facility-administered medications prior to visit.      Review of Systems:   Constitutional:   No  weight loss, night sweats,  Fevers, chills, fatigue, or  lassitude.  HEENT:   No headaches,  Difficulty swallowing,  Tooth/dental problems, or  Sore throat,  No sneezing, itching, ear ache,  +nasal congestion, post nasal drip,   CV:  No chest pain,  Orthopnea, PND, swelling in lower extremities, anasarca, dizziness, palpitations, syncope.   GI  No heartburn, indigestion, abdominal pain, nausea, vomiting, diarrhea, change in bowel habits, loss of appetite, bloody stools.   Resp: No shortness of breath with exertion or at rest.  No excess mucus, no productive cough,  No non-productive cough,  No coughing up of blood.  No change in color of mucus.  No wheezing.  No chest wall deformity  Skin: no rash or lesions.  GU: no dysuria, change in color of urine, no urgency or frequency.  No flank pain, no hematuria   MS:  No joint pain or swelling.  No decreased range of motion.  No back pain.    Physical Exam  BP 112/72 (BP  Location: Left Arm, Cuff Size: Normal)   Pulse 74   Temp 97.9 F (36.6 C)   Ht 5\' 10"  (1.778 m)   Wt 164 lb (74.4 kg)   SpO2 98%   BMI 23.53 kg/m   GEN: A/Ox3; pleasant , NAD, well nourished    HEENT:  [place]/AT,  EACs-clear, TMs-wnl, NOSE-clear, THROAT-clear, no lesions, no postnasal drip or exudate noted.   NECK:  Supple w/ fair ROM; no JVD; normal carotid impulses w/o bruits; no thyromegaly or nodules palpated; no lymphadenopathy.    RESP  Clear  P & A; w/o, wheezes/ rales/ or rhonchi. no accessory muscle use, no dullness to percussion  CARD:  RRR, no m/r/g, no peripheral edema, pulses intact, no cyanosis or clubbing.  GI:   Soft & nt; nml bowel sounds; no organomegaly or masses detected.   Musco: Warm bil, no deformities or joint swelling noted.   Neuro: alert, no focal deficits noted.    Skin: Warm, no lesions or rashes    Lab Results:   BNP No results found for: BNP  ProBNP No results found for: PROBNP  Imaging: No results found.    PFT Results Latest Ref Rng & Units 04/30/2016  FVC-Pre L 3.89  FVC-Predicted Pre % 83  FVC-Post L 3.94  FVC-Predicted Post % 84  Pre FEV1/FVC % % 72  Post FEV1/FCV % % 76  FEV1-Pre L 2.81  FEV1-Predicted Pre % 80  FEV1-Post L 2.98  DLCO UNC% % 69  DLCO COR %Predicted % 82  TLC L 5.99  TLC % Predicted % 85  RV % Predicted % 93    Lab Results  Component Value Date   NITRICOXIDE 24 03/13/2018        Assessment & Plan:   Mild persistent asthma Currently well controlled on Breo.  Patient is requesting a trial off of Breo which should be acceptable.  Have advised that if while off of Breo asthma flares with increased wheezing shortness of breath or increased albuterol use he will need to restart Breo.  He can call our office for further instructions.  He may use albuterol as needed for increased wheezing or shortness of breath.  Asthma action plan was discussed in detail.  Patient is moving back to [place].  Have  advised him to establish with primary care provider and pulmonary if he needs assistance with his and transfer records to get in touch with our office     Rubye Oaksammy Buckley Bradly, NP 03/21/2019

## 2019-05-15 IMAGING — CR DG CHEST 2V
2 series · 2 of 2 positions shown · non-contrast
Comparison: 01/25/2016

CLINICAL DATA: Irregular heart beat, chest tightness

EXAM:
CHEST  2 VIEW

[w chest pa]
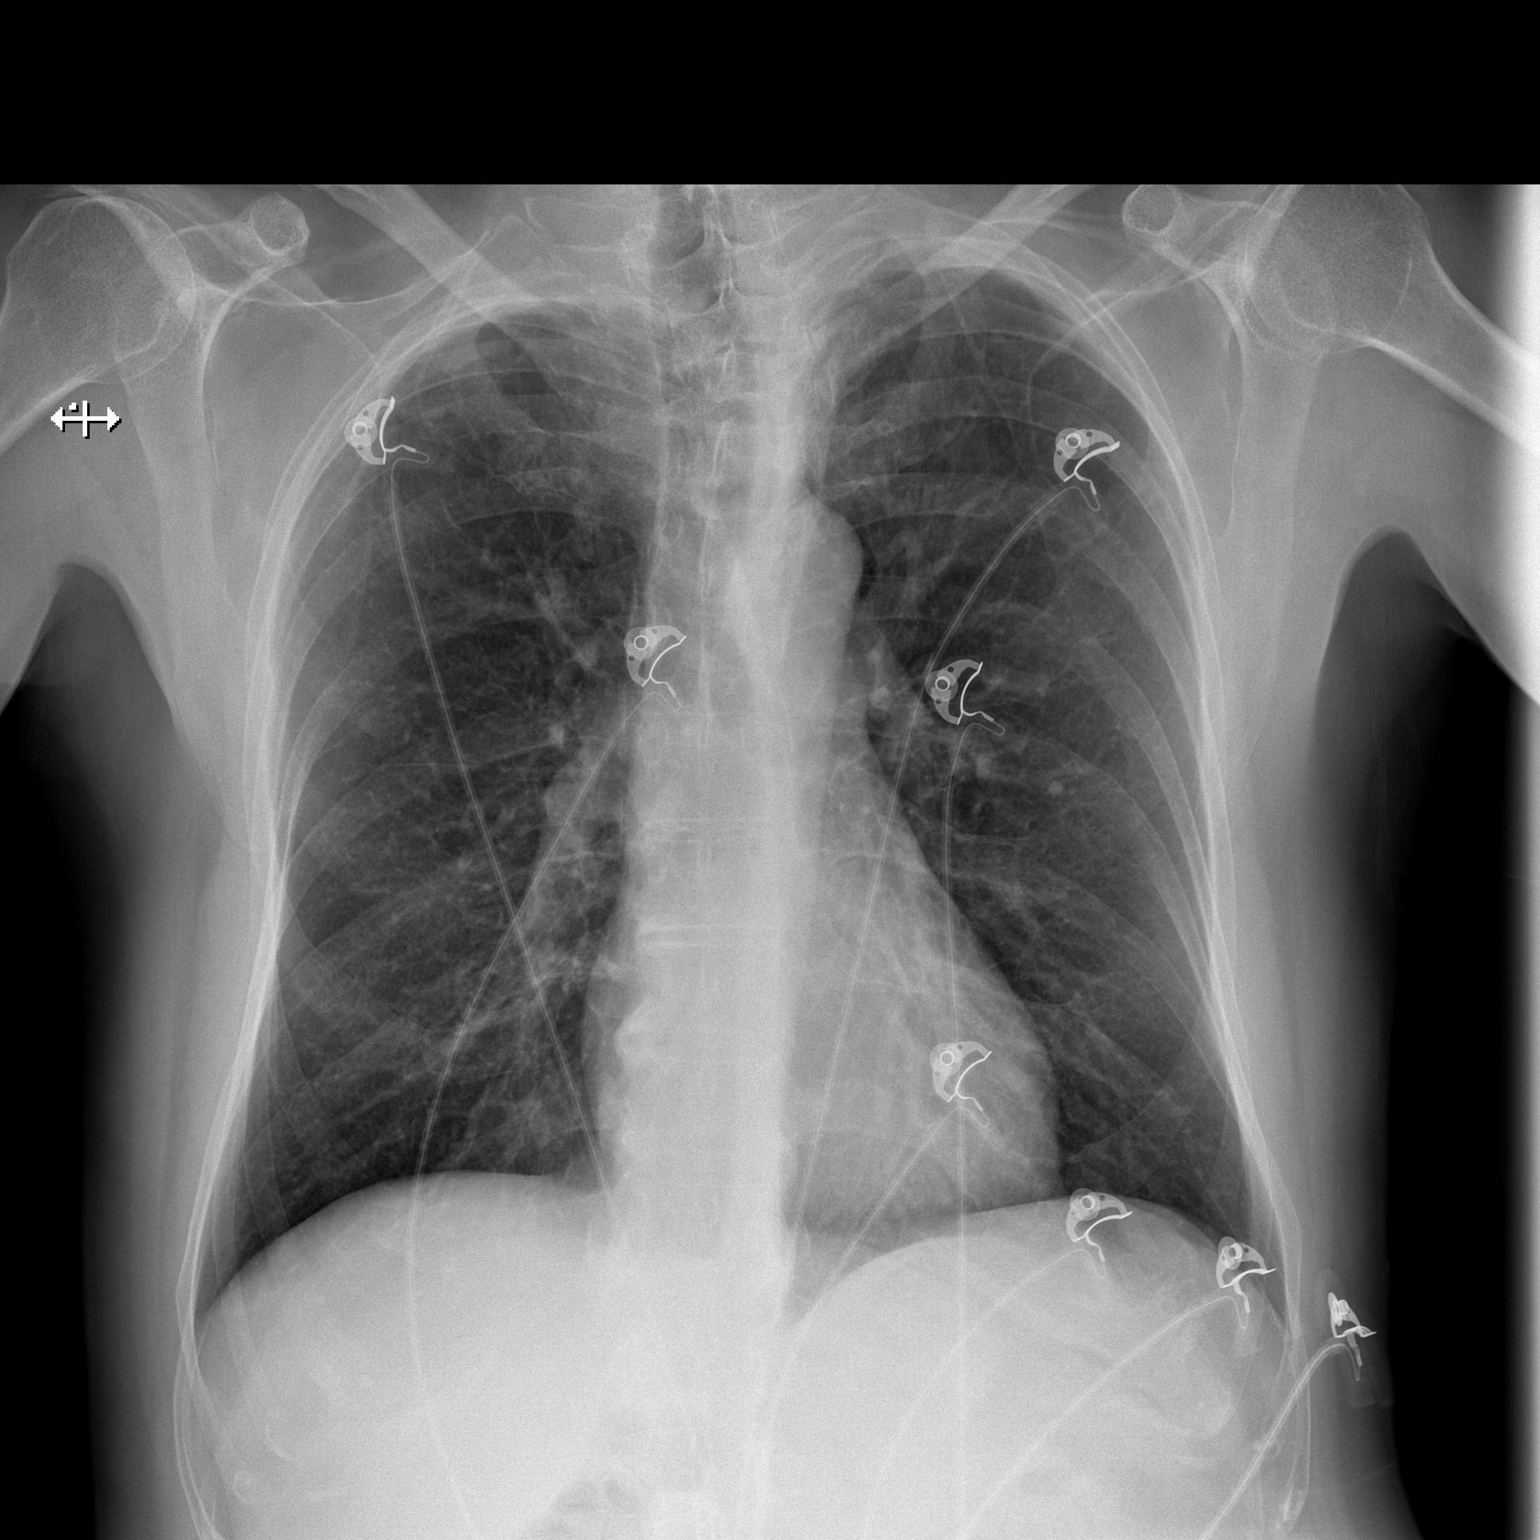

[w chest lat]
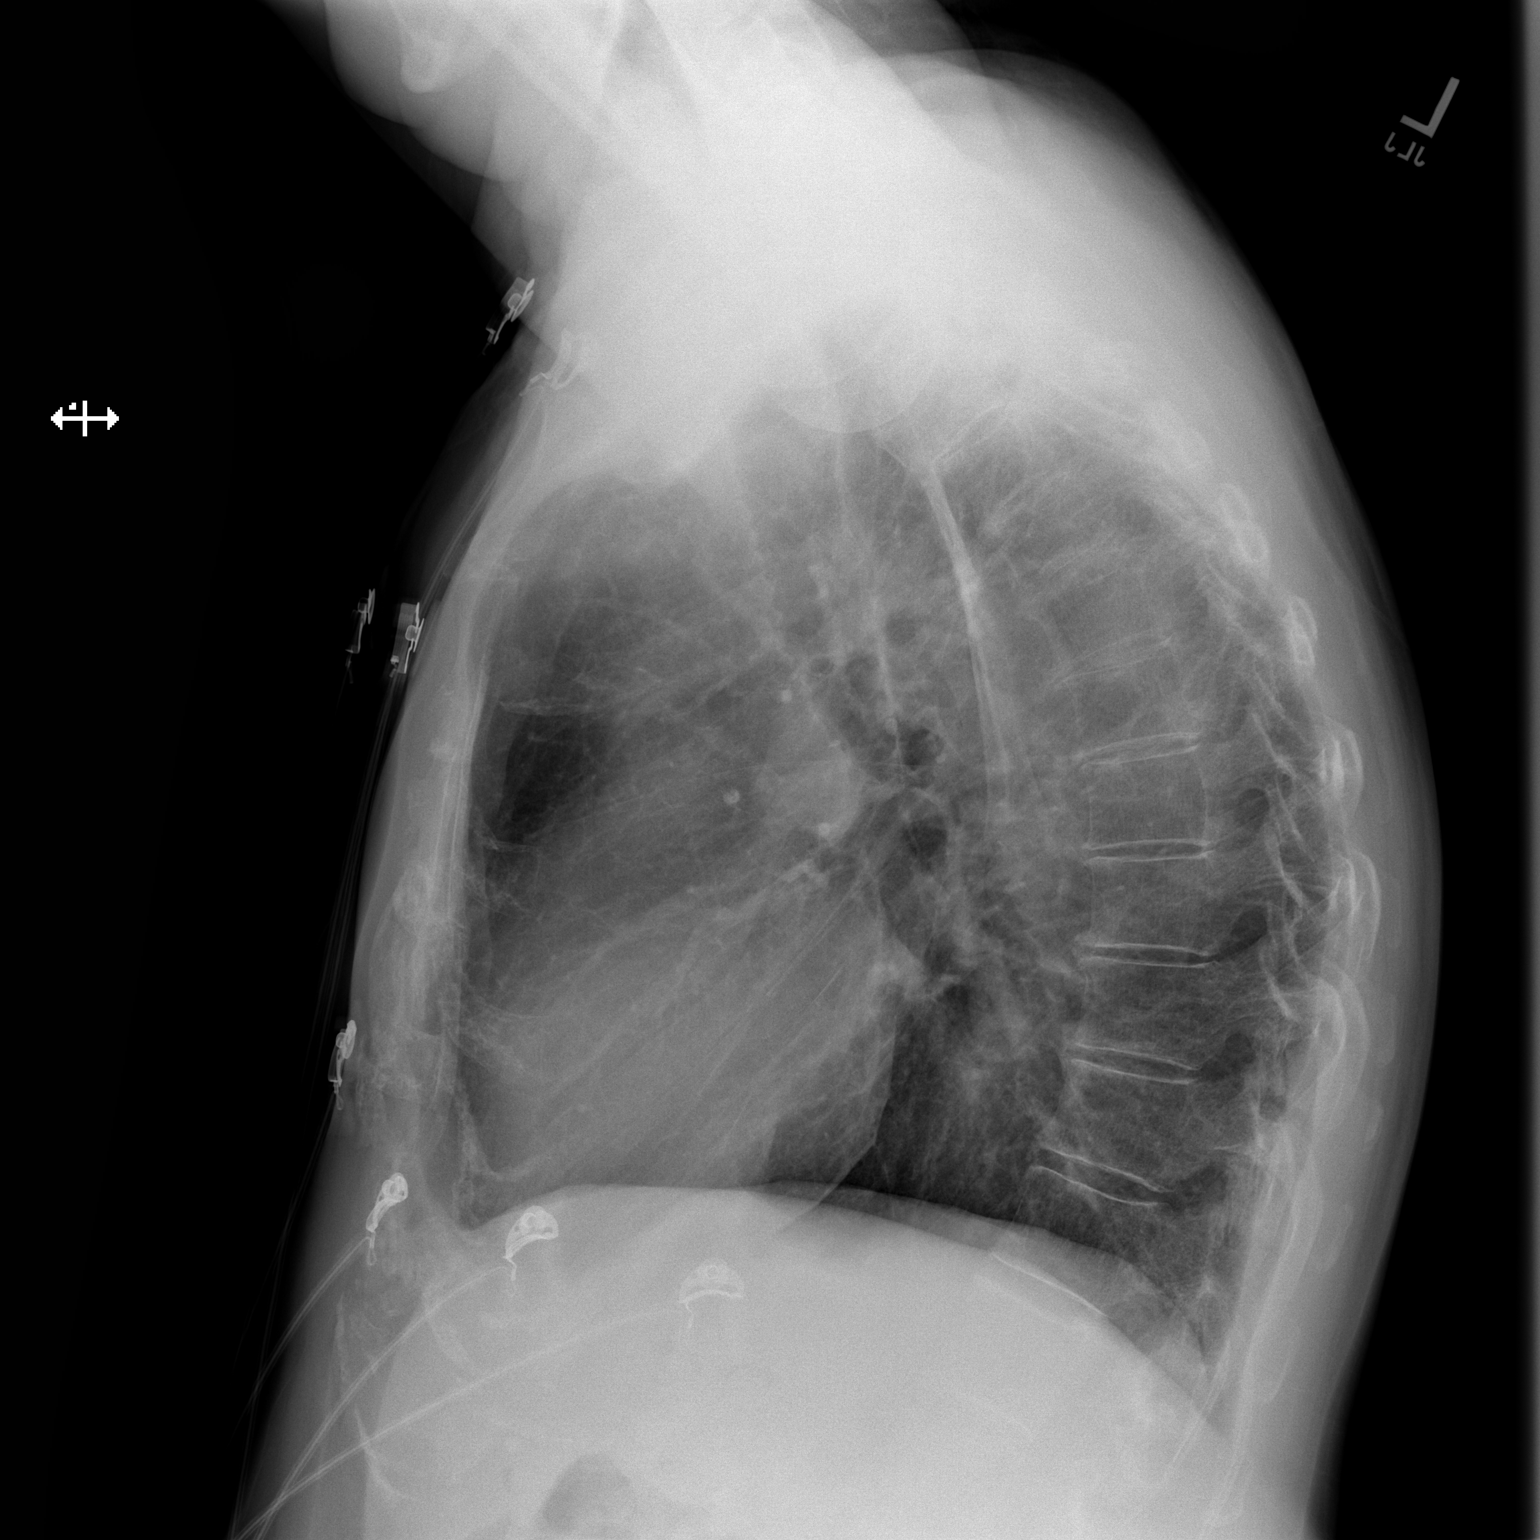

[2 of 2 positions shown; findings below may reference images not displayed]

FINDINGS: The heart size and mediastinal contours are within normal limits.
Both lungs are clear. The visualized skeletal structures are
unremarkable.
IMPRESSION: No active cardiopulmonary disease.

## 2019-12-28 ENCOUNTER — Telehealth: Payer: Self-pay | Admitting: Cardiology

## 2019-12-28 NOTE — Telephone Encounter (Signed)
Called patient from recall list to schedule appt, he has moved from [facility] to [place]. He now see a cardiologist there.
